# Patient Record
Sex: Male | Born: 1980 | Race: White | Hispanic: No | Marital: Single | Smoking: Current every day smoker
Health system: Southern US, Community
[De-identification: ages and names within clinical notes are randomized; demographics above are authoritative.]

## PROBLEM LIST (undated history)

## (undated) DIAGNOSIS — J939 Pneumothorax, unspecified: Secondary | ICD-10-CM

## (undated) HISTORY — PX: CHEST TUBE INSERTION: SHX231

---

## 2002-08-03 ENCOUNTER — Emergency Department (HOSPITAL_COMMUNITY): Admission: EM | Admit: 2002-08-03 | Discharge: 2002-08-03 | Payer: Self-pay | Admitting: Emergency Medicine

## 2002-10-20 ENCOUNTER — Emergency Department (HOSPITAL_COMMUNITY): Admission: EM | Admit: 2002-10-20 | Discharge: 2002-10-21 | Payer: Self-pay | Admitting: Emergency Medicine

## 2002-11-29 ENCOUNTER — Emergency Department (HOSPITAL_COMMUNITY): Admission: EM | Admit: 2002-11-29 | Discharge: 2002-11-30 | Payer: Self-pay | Admitting: *Deleted

## 2003-12-11 ENCOUNTER — Emergency Department (HOSPITAL_COMMUNITY): Admission: EM | Admit: 2003-12-11 | Discharge: 2003-12-12 | Payer: Self-pay | Admitting: Emergency Medicine

## 2004-07-26 ENCOUNTER — Emergency Department (HOSPITAL_COMMUNITY): Admission: EM | Admit: 2004-07-26 | Discharge: 2004-07-26 | Payer: Self-pay | Admitting: Emergency Medicine

## 2006-04-29 ENCOUNTER — Emergency Department (HOSPITAL_COMMUNITY): Admission: EM | Admit: 2006-04-29 | Discharge: 2006-04-29 | Payer: Self-pay | Admitting: Emergency Medicine

## 2006-11-04 ENCOUNTER — Emergency Department (HOSPITAL_COMMUNITY): Admission: EM | Admit: 2006-11-04 | Discharge: 2006-11-05 | Payer: Self-pay | Admitting: Emergency Medicine

## 2009-06-12 ENCOUNTER — Emergency Department (HOSPITAL_COMMUNITY): Admission: EM | Admit: 2009-06-12 | Discharge: 2009-06-13 | Payer: Self-pay | Admitting: Emergency Medicine

## 2011-02-13 ENCOUNTER — Encounter: Payer: Self-pay | Admitting: Emergency Medicine

## 2011-02-13 ENCOUNTER — Emergency Department (HOSPITAL_COMMUNITY)
Admission: EM | Admit: 2011-02-13 | Discharge: 2011-02-13 | Disposition: A | Discharge status: Home / Self Care | Payer: Self-pay | Attending: Emergency Medicine | Admitting: Emergency Medicine

## 2011-02-13 DIAGNOSIS — F17200 Nicotine dependence, unspecified, uncomplicated: Secondary | ICD-10-CM | POA: Insufficient documentation

## 2011-02-13 DIAGNOSIS — R1033 Periumbilical pain: Secondary | ICD-10-CM | POA: Insufficient documentation

## 2011-02-13 DIAGNOSIS — R197 Diarrhea, unspecified: Secondary | ICD-10-CM | POA: Insufficient documentation

## 2011-02-13 DIAGNOSIS — R630 Anorexia: Secondary | ICD-10-CM | POA: Insufficient documentation

## 2011-02-13 DIAGNOSIS — R109 Unspecified abdominal pain: Secondary | ICD-10-CM

## 2011-02-13 DIAGNOSIS — R112 Nausea with vomiting, unspecified: Secondary | ICD-10-CM | POA: Insufficient documentation

## 2011-02-13 LAB — COMPREHENSIVE METABOLIC PANEL
ALT: 17 U/L (ref 0–53)
AST: 21 U/L (ref 0–37)
CO2: 24 mEq/L (ref 19–32)
Creatinine, Ser: 0.79 mg/dL (ref 0.50–1.35)
GFR calc Af Amer: >60 mL/min (ref >60)
GFR calc non Af Amer: >60 mL/min (ref >60)
Potassium: 4 mEq/L (ref 3.5–5.1)
Sodium: 139 mEq/L (ref 135–145)
Total Bilirubin: 0.4 mg/dL (ref 0.3–1.2)

## 2011-02-13 LAB — URINALYSIS, ROUTINE W REFLEX MICROSCOPIC
Glucose, UA: NEGATIVE mg/dL
Leukocytes, UA: NEGATIVE
Nitrite: NEGATIVE
Protein, ur: NEGATIVE mg/dL
pH: 6 (ref 5.0–8.0)

## 2011-02-13 LAB — DIFFERENTIAL
Basophils Relative: 0 % (ref 0–1)
Lymphs Abs: 1.5 10*3/uL (ref 0.7–4.0)
Monocytes Relative: 8 % (ref 3–12)
Neutrophils Relative %: 75 % (ref 43–77)

## 2011-02-13 LAB — CBC
MCHC: 33.7 g/dL (ref 30.0–36.0)
MCV: 89.9 fL (ref 78.0–100.0)
Platelets: 227 10*3/uL (ref 150–400)
RDW: 12.7 % (ref 11.5–15.5)

## 2011-02-13 MED ORDER — PROMETHAZINE HCL 25 MG PO TABS: 25.0000 mg | ORAL_TABLET | Freq: Four times a day (QID) | ORAL | Status: AC | PRN

## 2011-02-13 NOTE — ED Notes (Signed)
Patient with no complaints at this time. Respirations even and unlabored. Skin warm/dry. Discharge instructions reviewed with patient at this time. Patient given opportunity to voice concerns/ask questions. IV removed per policy and band-aid applied to site. Patient discharged at this time and left Emergency Department with steady gait.  

## 2011-02-13 NOTE — ED Notes (Signed)
Pt c/o pain around navel area with n/v/d since last night. Denies stool black /bloody. Nad. No s/s of dehydration. Mm wet

## 2011-02-13 NOTE — ED Provider Notes (Signed)
History    Written by Enos Fling acting as scribe for Lyanne Co, MD.  Chief Complaint  Patient presents with  . Abdominal Pain  . Diarrhea   HPI Comments: Pt reports sharp periumbilical abd pain and diarrhea onset last night. Abd pain is intermittent, sometimes followed by diarrhea. Also reports 3 episodes of vomiting over past 3 weeks and another mild episode this AM. Nausea resolved after vomiting. Pt states he feels well right now, abd pain improved at this time.  Patient is a 30 y.o. male presenting with abdominal pain and diarrhea. The history is provided by the patient.  Abdominal Pain The primary symptoms of the illness include abdominal pain, nausea, vomiting and diarrhea.  The abdominal pain began 6 to 12 hours ago. The abdominal pain has been unchanged since its onset. The abdominal pain is located in the periumbilical region. The abdominal pain does not radiate. The abdominal pain is relieved by nothing.  The diarrhea began yesterday. The diarrhea occurs 2 to 4 times per day.  Additional symptoms associated with the illness include anorexia. Symptoms associated with the illness do not include chills, diaphoresis, heartburn, constipation, urgency, hematuria, frequency or back pain.  Diarrhea The primary symptoms include abdominal pain, nausea, vomiting and diarrhea.  The illness is also significant for anorexia. The illness does not include chills, constipation or back pain.    History reviewed. No pertinent past medical history.  History reviewed. No pertinent past surgical history.  History reviewed. No pertinent family history.  History  Substance Use Topics  . Smoking status: Current Everyday Smoker    Types: Cigarettes  . Smokeless tobacco: Not on file  . Alcohol Use: No      Review of Systems  Constitutional: Negative for chills and diaphoresis.  Gastrointestinal: Positive for nausea, vomiting, abdominal pain, diarrhea and anorexia. Negative for  heartburn and constipation.  Genitourinary: Negative for urgency, frequency and hematuria.  Musculoskeletal: Negative for back pain.  All other systems reviewed and are negative.    Physical Exam  BP 129/79  Pulse 80  Temp 97.6 F (36.4 C)  Resp 18  Ht 6\' 4"  (1.93 m)  Wt 150 lb (68.04 kg)  BMI 18.26 kg/m2  SpO2 100%  Physical Exam  Nursing note and vitals reviewed. Constitutional: He is oriented to person, place, and time. He appears well-developed and well-nourished. No distress.  HENT:  Head: Normocephalic.  Mouth/Throat: Mucous membranes are normal.  Eyes:       Normal appearance  Neck: Normal range of motion. Neck supple.  Cardiovascular: Normal rate and regular rhythm.  Exam reveals no gallop and no friction rub.   No murmur heard. Pulmonary/Chest: Effort normal and breath sounds normal. He has no wheezes. He has no rhonchi. He has no rales.  Abdominal: Soft. He exhibits no distension. There is no tenderness.  Musculoskeletal: Normal range of motion.       Normal appearance  Neurological: He is alert and oriented to person, place, and time.       Motor intact in all extremities  Skin: Skin is warm and dry. No rash noted.       Color normal  Psychiatric: He has a normal mood and affect.    ED Course  Procedures Results for orders placed during the hospital encounter of 02/13/11  CBC      Component Value Range   WBC 9.6  4.0 - 10.5 (K/uL)   RBC 5.05  4.22 - 5.81 (MIL/uL)   Hemoglobin 15.3  13.0 - 17.0 (g/dL)   HCT 16.1  09.6 - 04.5 (%)   MCV 89.9  78.0 - 100.0 (fL)   MCH 30.3  26.0 - 34.0 (pg)   MCHC 33.7  30.0 - 36.0 (g/dL)   RDW 40.9  81.1 - 91.4 (%)   Platelets 227  150 - 400 (K/uL)  DIFFERENTIAL      Component Value Range   Neutrophils Relative 75  43 - 77 (%)   Neutro Abs 7.2  1.7 - 7.7 (K/uL)   Lymphocytes Relative 16  12 - 46 (%)   Lymphs Abs 1.5  0.7 - 4.0 (K/uL)   Monocytes Relative 8  3 - 12 (%)   Monocytes Absolute 0.8  0.1 - 1.0 (K/uL)    Eosinophils Relative 1  0 - 5 (%)   Eosinophils Absolute 0.1  0.0 - 0.7 (K/uL)   Basophils Relative 0  0 - 1 (%)   Basophils Absolute 0.0  0.0 - 0.1 (K/uL)  COMPREHENSIVE METABOLIC PANEL      Component Value Range   Sodium 139  135 - 145 (mEq/L)   Potassium 4.0  3.5 - 5.1 (mEq/L)   Chloride 104  96 - 112 (mEq/L)   CO2 24  19 - 32 (mEq/L)   Glucose, Bld 108 (*) 70 - 99 (mg/dL)   BUN 14  6 - 23 (mg/dL)   Creatinine, Ser 7.82  0.50 - 1.35 (mg/dL)   Calcium 9.8  8.4 - 95.6 (mg/dL)   Total Protein 8.2  6.0 - 8.3 (g/dL)   Albumin 4.1  3.5 - 5.2 (g/dL)   AST 21  0 - 37 (U/L)   ALT 17  0 - 53 (U/L)   Alkaline Phosphatase 109  39 - 117 (U/L)   Total Bilirubin 0.4  0.3 - 1.2 (mg/dL)   GFR calc non Af Amer >60  >60 (mL/min)   GFR calc Af Amer >60  >60 (mL/min)  URINALYSIS, ROUTINE W REFLEX MICROSCOPIC      Component Value Range   Color, Urine YELLOW  YELLOW    Appearance CLEAR  CLEAR    Specific Gravity, Urine 1.010  1.005 - 1.030    pH 6.0  5.0 - 8.0    Glucose, UA NEGATIVE  NEGATIVE (mg/dL)   Hgb urine dipstick NEGATIVE  NEGATIVE    Bilirubin Urine NEGATIVE  NEGATIVE    Ketones, ur NEGATIVE  NEGATIVE (mg/dL)   Protein, ur NEGATIVE  NEGATIVE (mg/dL)   Urobilinogen, UA 0.2  0.0 - 1.0 (mg/dL)   Nitrite NEGATIVE  NEGATIVE    Leukocytes, UA NEGATIVE  NEGATIVE    No results found.   MDM  Personally reviewed the laboratory studies.  The patient's abdomen is benign on exam.  My suspicion for appendicitis is very low at this time.  No indication for imaging of the abdomen at this time.  Patient has been asked to return to the ER tomorrow for 24-hour abdominal pain recheck.  Patient is agreeable to outpatient plan.  He understands to return sooner for worsening abdominal pain fever greater than 101 or any other new or worsening symptoms  I personally performed the services described in this documentation, which was scribed in my presence. The recorded information has been reviewed and  considered. Lyanne Co, MD        Lyanne Co, MD 02/13/11 [phone]

## 2011-08-28 ENCOUNTER — Emergency Department (HOSPITAL_COMMUNITY): Payer: Self-pay

## 2011-08-28 ENCOUNTER — Inpatient Hospital Stay (HOSPITAL_COMMUNITY)
Admission: EM | Admit: 2011-08-28 | Discharge: 2011-08-30 | DRG: 201 | Disposition: A | Discharge status: Home / Self Care | Payer: Self-pay | Attending: General Surgery | Admitting: General Surgery

## 2011-08-28 ENCOUNTER — Encounter (HOSPITAL_COMMUNITY): Payer: Self-pay | Admitting: *Deleted

## 2011-08-28 ENCOUNTER — Other Ambulatory Visit: Payer: Self-pay

## 2011-08-28 DIAGNOSIS — J9383 Other pneumothorax: Principal | ICD-10-CM | POA: Diagnosis present

## 2011-08-28 DIAGNOSIS — F17200 Nicotine dependence, unspecified, uncomplicated: Secondary | ICD-10-CM | POA: Diagnosis present

## 2011-08-28 DIAGNOSIS — K029 Dental caries, unspecified: Secondary | ICD-10-CM | POA: Diagnosis present

## 2011-08-28 LAB — PROTIME-INR
INR: 1.04 (ref 0.00–1.49)
Prothrombin Time: 13.8 seconds (ref 11.6–15.2)

## 2011-08-28 LAB — DIFFERENTIAL
Basophils Absolute: 0 10*3/uL (ref 0.0–0.1)
Basophils Relative: 0 % (ref 0–1)
Eosinophils Absolute: 0.1 10*3/uL (ref 0.0–0.7)
Eosinophils Relative: 1 % (ref 0–5)
Monocytes Absolute: 0.9 10*3/uL (ref 0.1–1.0)
Monocytes Relative: 9 % (ref 3–12)
Neutrophils Relative %: 72 % (ref 43–77)

## 2011-08-28 LAB — CBC
HCT: 42.3 % (ref 39.0–52.0)
MCH: 30.1 pg (ref 26.0–34.0)
Platelets: 213 10*3/uL (ref 150–400)
WBC: 9.9 10*3/uL (ref 4.0–10.5)

## 2011-08-28 LAB — BASIC METABOLIC PANEL
Calcium: 9.7 mg/dL (ref 8.4–10.5)
Creatinine, Ser: 0.86 mg/dL (ref 0.50–1.35)
GFR calc non Af Amer: >90 mL/min (ref >90)

## 2011-08-28 MED ORDER — PANTOPRAZOLE SODIUM 40 MG IV SOLR
40.0000 mg | Freq: Every day | INTRAVENOUS | Status: DC
  Administered 2011-08-28 – 2011-08-29 (×2): 40 mg via INTRAVENOUS
  Filled 2011-08-28 (×2): qty 40

## 2011-08-28 MED ORDER — HYDROMORPHONE HCL PF 1 MG/ML IJ SOLN
1.0000 mg | Freq: Once | INTRAMUSCULAR | Status: AC
  Administered 2011-08-28: 1 mg via INTRAVENOUS
  Filled 2011-08-28: qty 1

## 2011-08-28 MED ORDER — ONDANSETRON HCL 4 MG/2ML IJ SOLN: 4.0000 mg | Freq: Four times a day (QID) | INTRAMUSCULAR | Status: DC | PRN

## 2011-08-28 MED ORDER — HYDROMORPHONE HCL PF 1 MG/ML IJ SOLN
INTRAMUSCULAR | Status: AC
  Administered 2011-08-28: 1 mg
  Filled 2011-08-28: qty 1

## 2011-08-28 MED ORDER — BIOTENE DRY MOUTH MT LIQD
15.0000 mL | Freq: Two times a day (BID) | OROMUCOSAL | Status: DC
  Administered 2011-08-29 – 2011-08-30 (×3): 15 mL via OROMUCOSAL

## 2011-08-28 MED ORDER — ENOXAPARIN SODIUM 40 MG/0.4ML ~~LOC~~ SOLN
40.0000 mg | SUBCUTANEOUS | Status: DC
  Administered 2011-08-28 – 2011-08-29 (×2): 40 mg via SUBCUTANEOUS
  Filled 2011-08-28: qty 0.4
  Filled 2011-08-28: qty 0.8

## 2011-08-28 MED ORDER — ONDANSETRON HCL 4 MG/2ML IJ SOLN
INTRAMUSCULAR | Status: AC
  Administered 2011-08-28: 4 mg via INTRAVENOUS
  Filled 2011-08-28: qty 2

## 2011-08-28 MED ORDER — NICOTINE 21 MG/24HR TD PT24
21.0000 mg | MEDICATED_PATCH | Freq: Every day | TRANSDERMAL | Status: DC
  Administered 2011-08-28 – 2011-08-30 (×3): 21 mg via TRANSDERMAL
  Filled 2011-08-28 (×3): qty 1

## 2011-08-28 MED ORDER — PNEUMOCOCCAL VAC POLYVALENT 25 MCG/0.5ML IJ INJ
0.5000 mL | INJECTION | INTRAMUSCULAR | Status: AC
  Administered 2011-08-29: 0.5 mL via INTRAMUSCULAR
  Filled 2011-08-28: qty 0.5

## 2011-08-28 MED ORDER — INFLUENZA VIRUS VACC SPLIT PF IM SUSP
0.5000 mL | INTRAMUSCULAR | Status: AC
  Administered 2011-08-29: 0.5 mL via INTRAMUSCULAR
  Filled 2011-08-28: qty 0.5

## 2011-08-28 MED ORDER — HYDROCODONE-ACETAMINOPHEN 5-325 MG PO TABS
1.0000 | ORAL_TABLET | ORAL | Status: DC | PRN
  Administered 2011-08-29 (×3): 2 via ORAL
  Administered 2011-08-29: 1 via ORAL
  Filled 2011-08-28: qty 1
  Filled 2011-08-28 (×3): qty 2

## 2011-08-28 MED ORDER — SODIUM CHLORIDE 0.9 % IV SOLN
Freq: Once | INTRAVENOUS | Status: AC
  Administered 2011-08-28: 20:00:00 via INTRAVENOUS

## 2011-08-28 MED ORDER — HYDROMORPHONE HCL PF 1 MG/ML IJ SOLN: 0.5000 mg | INTRAMUSCULAR | Status: DC | PRN

## 2011-08-28 NOTE — ED Provider Notes (Signed)
This chart was scribed for [employer]. Colon Branch, MD by Wallis Mart. The patient was seen in room APA08/APA08 and the patient's care was started at 7:19 PM.   CSN: [id number]  Arrival date & time 08/28/11  1804   First MD Initiated Contact with Patient 08/28/11 1912      Chief Complaint  Patient presents with  . Chest Pain    (Consider location/radiation/quality/duration/timing/severity/associated sxs/prior treatment) HPI  Travis Allen is a 31 y.o. male who presents to the Emergency Department complaining of sudden onset, peristance of constant moderate to severe left sided chest pain that radiates down his side that started this morning at 8 AM as soon as he woke up and got out of bed.  Pt c/o associated coughing and SOB.  Pain worsens when taking a deep breath and coughing.   Nothing improves the pain.  Pt denies nausea, vomiting.   History reviewed. No pertinent past medical history.  History reviewed. No pertinent past surgical history.  History reviewed. No pertinent family history.  History  Substance Use Topics  . Smoking status: Current Everyday Smoker -- 1.0 packs/day    Types: Cigarettes  . Smokeless tobacco: Not on file  . Alcohol Use: Yes     occasionally      Review of Systems 10 Systems reviewed and are negative for acute change except as noted in the HPI.  Allergies  Review of patient's allergies indicates no known allergies.  Home Medications  No current outpatient prescriptions on file.  BP 120/67  Pulse 84  Temp(Src) 97.8 F (36.6 C) (Oral)  Resp 18  Ht 6\' 4"  (1.93 m)  Wt 140 lb (63.504 kg)  BMI 17.04 kg/m2  SpO2 100%  Physical Exam  Nursing note and vitals reviewed. Constitutional: He is oriented to person, place, and time. He appears well-developed and well-nourished. No distress.  HENT:  Head: Normocephalic and atraumatic.       Multiple teeth with dental decay, broken teeth  Eyes: EOM are normal. Pupils are equal, round, and reactive  to light.  Neck: Normal range of motion. Neck supple. No tracheal deviation present.  Cardiovascular: Normal rate and regular rhythm.  Exam reveals no friction rub.   No murmur heard. Pulmonary/Chest: Effort normal and breath sounds normal. No respiratory distress. He has no wheezes. He has no rales.       Few fine crackles at left base  Abdominal: Soft. He exhibits no distension.  Musculoskeletal: Normal range of motion. He exhibits no edema.  Neurological: He is alert and oriented to person, place, and time. No sensory deficit.  Skin: Skin is warm and dry.  Psychiatric: He has a normal mood and affect. His behavior is normal.    ED Course  Procedures (including critical care time) DIAGNOSTIC STUDIES: Oxygen Saturation is 100% on room air, normal by my interpretation.    COORDINATION OF CARE:  7:20 PM: EDP discussed radiology results (40% pneumothorax) with pt, waiting for on-call surgeon to call back to schedule surgery 7:24 Spoke with Dr. Leticia Penna, general surgeon. He will place chest tube and arrange for admission. Labs Reviewed - No data to display Dg Chest 2 View  08/28/2011  *RADIOLOGY REPORT*  Clinical Data: Chest pain  CHEST - 2 VIEW  Comparison: None.  Findings: Cardiomediastinal silhouette is unremarkable.  There is a large at least 40% left pneumothorax.  Mild atelectasis left lower lobe.  Right lung is clear. No pulmonary edema.  IMPRESSION:  Large left pneumothorax at least 40%.  Critical findings discussed with Dr. Colon Branch from emergency room.  Original Report Authenticated By: Natasha Mead, M.D.   Dg Chest Portable 1 View  08/28/2011  *RADIOLOGY REPORT*  Clinical Data: Pneumothorax, post chest tube placement  PORTABLE CHEST - 1 VIEW  Comparison:  Earlier film of the same day  Findings: There has been placement of a small caliber   left chest tube laterally.  Interval evacuation of the pneumothorax with only a trivial residual component noted laterally at the apex.  Lungs are  clear.  Heart size normal.  IMPRESSION:  Left chest tube placement with near complete evacuation of pneumothorax.  Original Report Authenticated By: Osa Craver, M.D.   Results for orders placed during the hospital encounter of 08/28/11  CBC      Component Value Range   WBC 9.9  4.0 - 10.5 (K/uL)   RBC 4.71  4.22 - 5.81 (MIL/uL)   Hemoglobin 14.2  13.0 - 17.0 (g/dL)   HCT 16.1  09.6 - 04.5 (%)   MCV 89.8  78.0 - 100.0 (fL)   MCH 30.1  26.0 - 34.0 (pg)   MCHC 33.6  30.0 - 36.0 (g/dL)   RDW 40.9  81.1 - 91.4 (%)   Platelets 213  150 - 400 (K/uL)  DIFFERENTIAL      Component Value Range   Neutrophils Relative 72  43 - 77 (%)   Neutro Abs 7.2  1.7 - 7.7 (K/uL)   Lymphocytes Relative 18  12 - 46 (%)   Lymphs Abs 1.7  0.7 - 4.0 (K/uL)   Monocytes Relative 9  3 - 12 (%)   Monocytes Absolute 0.9  0.1 - 1.0 (K/uL)   Eosinophils Relative 1  0 - 5 (%)   Eosinophils Absolute 0.1  0.0 - 0.7 (K/uL)   Basophils Relative 0  0 - 1 (%)   Basophils Absolute 0.0  0.0 - 0.1 (K/uL)  BASIC METABOLIC PANEL      Component Value Range   Sodium 137  135 - 145 (mEq/L)   Potassium 4.0  3.5 - 5.1 (mEq/L)   Chloride 103  96 - 112 (mEq/L)   CO2 26  19 - 32 (mEq/L)   Glucose, Bld 85  70 - 99 (mg/dL)   BUN 9  6 - 23 (mg/dL)   Creatinine, Ser 7.82  0.50 - 1.35 (mg/dL)   Calcium 9.7  8.4 - 95.6 (mg/dL)   GFR calc non Af Amer >90  >90 (mL/min)   GFR calc Af Amer >90  >90 (mL/min)  PROTIME-INR      Component Value Range   Prothrombin Time 13.8  11.6 - 15.2 (seconds)   INR 1.04  0.00 - 1.49   APTT      Component Value Range   aPTT 32  24 - 37 (seconds)       MDM  Patient with left sided chest pain and shortness of breath that began this morning when he woke up. Smokes a pack of cigarettes a day.Chest xray with 40% pneumothorax. General surgeon called who placed chest tube and admitted patient to med surg. The patient appears reasonably stabilized for admission considering the current resources,  flow, and capabilities available in the ED at this time, and I doubt any other [facility] requiring further screening and/or treatment in the ED prior to admission.  I personally performed the services described in this documentation, which was scribed in my presence. The recorded information has been reviewed and considered.  MDM  Reviewed: nursing note and vitals Interpretation: x-ray and labs Consults: general surgery       Nicoletta Dress. Colon Branch, MD 08/28/11 2137

## 2011-08-28 NOTE — ED Provider Notes (Signed)
1909 Call from radiology to advise patient with 40% pneumothorax.  Nicoletta Dress. Colon Branch, MD 08/28/11 Izell [place]

## 2011-08-28 NOTE — Procedures (Signed)
Chest Tube Insertion Procedure Note  Indications:  Clinically significant Pneumothorax  Pre-operative Diagnosis: Pneumothorax  Post-operative Diagnosis: Pneumothorax  Procedure Details  Informed consent was obtained for the procedure, including sedation.  Risks of lung perforation, hemorrhage, arrhythmia, and adverse drug reaction were discussed.   After sterile skin prep, using standard technique, a 8 French tube was placed in the left lateral ~4-5 rib space.  Findings: None  Estimated Blood Loss:  Minimal         Specimens:  None              Complications:  None; patient tolerated the procedure well.         Disposition: To floor for admission and management of chest-tube         Condition: stable  Attending Attestation: I performed the procedure.

## 2011-08-28 NOTE — ED Notes (Signed)
Dr zieglar in tom place in chest tube.

## 2011-08-28 NOTE — H&P (Signed)
Travis Allen is an 31 y.o. male.   Chief Complaint: Chest pain  HPI: Patient presented with less than 24 hours of left sided schest pain.  No similar symptoms in the past.  No trauma.  Awoke with symptoms.  Persistent with continued SOB through the day.  History reviewed. No pertinent past medical history.  History reviewed. No pertinent past surgical history.  History reviewed. No pertinent family history. Social History:  reports that he has been smoking Cigarettes.  He has been smoking about 1 pack per day. He does not have any smokeless tobacco history on file. He reports that he drinks alcohol. He reports that he does not use illicit drugs.  Allergies: No Known Allergies  Medications Prior to Admission  Medication Dose Route Frequency Provider Last Rate Last Dose  . 0.9 %  sodium chloride infusion   Intravenous Once [employer]. Colon Branch, MD 75 mL/hr at 08/28/11 1940    . enoxaparin (LOVENOX) injection 40 mg  40 mg Subcutaneous Q24H Fabio Bering, MD      . HYDROcodone-acetaminophen [facility]) 5-325 MG per tablet 1-2 tablet  1-2 tablet Oral Q4H PRN Fabio Bering, MD      . HYDROmorphone (DILAUDID) 1 MG/ML injection        1 mg at 08/28/11 2022  . HYDROmorphone (DILAUDID) injection 0.5 mg  0.5 mg Intravenous Q4H PRN Fabio Bering, MD      . HYDROmorphone (DILAUDID) injection 1 mg  1 mg Intravenous Once Fabio Bering, MD      . nicotine (NICODERM CQ - dosed in mg/24 hours) patch 21 mg  21 mg Transdermal Daily Fabio Bering, MD      . ondansetron [facility]) 4 MG/2ML injection        4 mg at 08/28/11 2047  . ondansetron (ZOFRAN) injection 4 mg  4 mg Intravenous Q6H PRN Fabio Bering, MD      . pantoprazole (PROTONIX) injection 40 mg  40 mg Intravenous QHS Fabio Bering, MD       No current outpatient prescriptions on file as of 08/28/2011.    Results for orders placed during the hospital encounter of 08/28/11 (from the past 48 hour(s))  CBC     Status: Normal   Collection Time   08/28/11  7:34 PM      Component Value Range Comment   WBC 9.9  4.0 - 10.5 (K/uL)    RBC 4.71  4.22 - 5.81 (MIL/uL)    Hemoglobin 14.2  13.0 - 17.0 (g/dL)    HCT 78.2  95.6 - 21.3 (%)    MCV 89.8  78.0 - 100.0 (fL)    MCH 30.1  26.0 - 34.0 (pg)    MCHC 33.6  30.0 - 36.0 (g/dL)    RDW 08.6  57.8 - 46.9 (%)    Platelets 213  150 - 400 (K/uL)   DIFFERENTIAL     Status: Normal   Collection Time   08/28/11  7:34 PM      Component Value Range Comment   Neutrophils Relative 72  43 - 77 (%)    Neutro Abs 7.2  1.7 - 7.7 (K/uL)    Lymphocytes Relative 18  12 - 46 (%)    Lymphs Abs 1.7  0.7 - 4.0 (K/uL)    Monocytes Relative 9  3 - 12 (%)    Monocytes Absolute 0.9  0.1 - 1.0 (K/uL)    Eosinophils Relative 1  0 - 5 (%)  Eosinophils Absolute 0.1  0.0 - 0.7 (K/uL)    Basophils Relative 0  0 - 1 (%)    Basophils Absolute 0.0  0.0 - 0.1 (K/uL)   BASIC METABOLIC PANEL     Status: Normal   Collection Time   08/28/11  7:34 PM      Component Value Range Comment   Sodium 137  135 - 145 (mEq/L)    Potassium 4.0  3.5 - 5.1 (mEq/L)    Chloride 103  96 - 112 (mEq/L)    CO2 26  19 - 32 (mEq/L)    Glucose, Bld 85  70 - 99 (mg/dL)    BUN 9  6 - 23 (mg/dL)    Creatinine, Ser 2.95  0.50 - 1.35 (mg/dL)    Calcium 9.7  8.4 - 10.5 (mg/dL)    GFR calc non Af Amer >90  >90 (mL/min)    GFR calc Af Amer >90  >90 (mL/min)   PROTIME-INR     Status: Normal   Collection Time   08/28/11  7:34 PM      Component Value Range Comment   Prothrombin Time 13.8  11.6 - 15.2 (seconds)    INR 1.04  0.00 - 1.49    APTT     Status: Normal   Collection Time   08/28/11  7:34 PM      Component Value Range Comment   aPTT 32  24 - 37 (seconds)    Dg Chest 2 View  08/28/2011  *RADIOLOGY REPORT*  Clinical Data: Chest pain  CHEST - 2 VIEW  Comparison: None.  Findings: Cardiomediastinal silhouette is unremarkable.  There is a large at least 40% left pneumothorax.  Mild atelectasis left lower lobe.  Right lung is clear. No pulmonary  edema.  IMPRESSION:  Large left pneumothorax at least 40%.  Critical findings discussed with Dr. Colon Branch from emergency room.  Original Report Authenticated By: Natasha Mead, M.D.    Review of Systems  Constitutional: Negative.   HENT: Negative.   Eyes: Negative.   Respiratory: Positive for cough and shortness of breath.   Cardiovascular: Positive for chest pain.  Gastrointestinal: Negative.   Genitourinary: Negative.   Musculoskeletal: Negative.   Skin: Negative.   Neurological: Negative.   Endo/Heme/Allergies: Negative.   Psychiatric/Behavioral: Negative.     Blood pressure 120/67, pulse 84, temperature 97.8 F (36.6 C), temperature source Oral, resp. rate 18, height 6\' 4"  (1.93 m), weight 63.504 kg (140 lb), SpO2 100.00%. Physical Exam  Constitutional: He is oriented to person, place, and time. He appears well-developed and well-nourished. No distress.       Thin, tall   HENT:  Head: Normocephalic and atraumatic.  Eyes: Conjunctivae and EOM are normal. Pupils are equal, round, and reactive to light. No scleral icterus.  Neck: Neck supple. No tracheal deviation present. No thyromegaly present.  Cardiovascular: Normal rate, regular rhythm and normal heart sounds.   Respiratory: Effort normal.       Decreased sounds on the Left  GI: Soft. Bowel sounds are normal. He exhibits no distension. There is no tenderness. There is no rebound.  Musculoskeletal: Normal range of motion.  Lymphadenopathy:    He has no cervical adenopathy.  Neurological: He is alert and oriented to person, place, and time.  Skin: Skin is warm and dry.     Assessment/Plan Left spontaneous PTX.  Risks, benefits and alternatives to pneumocath vs chest tube discussed with the patient.  Opted to proceed with pneumocath.  Placed in  the ED.  Tolerated well.  Will admit for continued ct management.    Bronda Alfred C 08/28/2011, 8:51 PM

## 2011-08-28 NOTE — ED Notes (Signed)
Attempted to call report

## 2011-08-28 NOTE — ED Notes (Signed)
Pt states that he started having chest pain at 0830 this am and shortness of breath. Pt states that the pain runs down his left side. Denies nausea, or vomiting. Pt also c/o dry cough that makes his pain worse.

## 2011-08-29 NOTE — Progress Notes (Signed)
  Subjective: No shortness of breath. Chest pain is improved.  Objective: Vital signs in last 24 hours: Temp:  [97 F (36.1 C)-98.7 F (37.1 C)] 97 F (36.1 C) (02/07 0927) Pulse Rate:  [58-86] 60  (02/07 0927) Resp:  [13-23] 18  (02/07 0927) BP: (91-123)/(55-70) 102/63 mmHg (02/07 0927) SpO2:  [96 %-100 %] 96 % (02/07 0940) Weight:  [63.504 kg (140 lb)] 63.504 kg (140 lb) (02/06 1811) Last BM Date: 08/28/11  Intake/Output from previous day: 02/06 0701 - 02/07 0700 In: -  Out: 500 [Urine:500] Intake/Output this shift: Total I/O In: 240 [P.O.:240] Out: 900 [Urine:900]  Resp: clear to auscultation bilaterally and No evidence of any air leak from the chest tube. Noted Pleur-evac is to water seal.  Lab Results:   [facility] 08/28/11 1934  WBC 9.9  HGB 14.2  HCT 42.3  PLT 213   BMET  Basename 08/28/11 1934  NA 137  K 4.0  CL 103  CO2 26  GLUCOSE 85  BUN 9  CREATININE 0.86  CALCIUM 9.7   PT/INR  Basename 08/28/11 1934  LABPROT 13.8  INR 1.04   ABG No results found for this basename: PHART:2,PCO2:2,PO2:2,HCO3:2 in the last 72 hours  Studies/Results: Dg Chest 2 View  08/28/2011  *RADIOLOGY REPORT*  Clinical Data: Chest pain  CHEST - 2 VIEW  Comparison: None.  Findings: Cardiomediastinal silhouette is unremarkable.  There is a large at least 40% left pneumothorax.  Mild atelectasis left lower lobe.  Right lung is clear. No pulmonary edema.  IMPRESSION:  Large left pneumothorax at least 40%.  Critical findings discussed with Dr. Colon Branch from emergency room.  Original Report Authenticated By: Natasha Mead, M.D.   Dg Chest Portable 1 View  08/28/2011  *RADIOLOGY REPORT*  Clinical Data: Pneumothorax, post chest tube placement  PORTABLE CHEST - 1 VIEW  Comparison:  Earlier film of the same day  Findings: There has been placement of a small caliber   left chest tube laterally.  Interval evacuation of the pneumothorax with only a trivial residual component noted laterally at  the apex.  Lungs are clear.  Heart size normal.  IMPRESSION:  Left chest tube placement with near complete evacuation of pneumothorax.  Original Report Authenticated By: Osa Craver, M.D.    Anti-infectives: Anti-infectives    None      Assessment/Plan: s/p * No surgery found * Left-sided pneumothorax. Continue pneumocath for now. Continued to water seal. We'll reevaluate chest x-ray in the morning likely discharge tomorrow after removal of pneumocath.  LOS: 1 day    Jaimes Eckert C 08/29/2011

## 2011-08-29 NOTE — Progress Notes (Deleted)
PIV removed without complaint, patient discharged home. Patient verbalizes understanding of discharge instructions, follow up care and prescriptions. Patient escorted out by staff, transported by family. 

## 2011-08-30 ENCOUNTER — Inpatient Hospital Stay (HOSPITAL_COMMUNITY): Payer: Self-pay

## 2011-08-30 NOTE — Progress Notes (Signed)
UR Chart Review Completed  

## 2011-08-30 NOTE — Discharge Summary (Signed)
Physician Discharge Summary  Patient ID: TC KAPUSTA MRN: [id number] DOB/AGE: 33-Oct-1982 32 y.o.  Admit date: 08/28/2011 Discharge date: 08/30/2011  Admission Diagnoses: Left-sided spontaneous pneumothorax Discharge Diagnoses: The same Active Problems:  * No active hospital problems. *    Discharged Condition: stable  Hospital Course: Patient presented to [facility] emergency department with increased shortness of breath and chest pain. Workup was consistent with a spontaneous pneumothorax. Risks benefits and alternatives of intervention were discussed with the patient. In pneumocath was placed in the emergency department. Patient was admitted for continued management of the pneumothorax. 4 hours after placed in an intact there is no evidence of any recurrence or air leak from the Pleur-evac. At this point the catheter was placed to waterseal and was again monitored for another 24 hours. At this point again morning x-ray demonstrates no evidence of recurrence of pneumothorax. There is no air leak within the Pleur-evac canister. In the chest tube is removed at this point. Occlusive dressing was placed. Patient is doing extremely well. I did advise the patient to MICU daily over the weekend however he may return to work on Monday. Patient is to call if any problems questions or concerns.  Consults: None  Significant Diagnostic Studies: radiology: CXR: Pneumothorax  Treatments: Chest tube  Discharge Exam: Blood pressure 109/68, pulse 69, temperature 97.4 F (36.3 C), temperature source Oral, resp. rate 18, height 6\' 4"  (1.93 m), weight 63.504 kg (140 lb), SpO2 95.00%. General appearance: alert and no distress Resp: clear to auscultation bilaterally.  No air leak within the canister. The cath was removed at the bedside with occlusive dressing placed.  Disposition: Home or Self Care  Discharge Orders    Future Orders Please Complete By Expires   Diet - low sodium heart healthy      Increase activity slowly      Discharge instructions      Comments:   Increase activity as tolerated. May place ice pack for comfort.    Discharge wound care:      Comments:   Clean surgical sites with soap and water.  May shower the morning after surgery unless instructed by Dr. Leticia Penna otherwise.  No soaking for 2-3 weeks.    If adhesive strips are in place, they may be removed in 1-2 weeks while in the shower.    Call MD for:  temperature >100.4      Call MD for:  persistant nausea and vomiting      Call MD for:  severe uncontrolled pain      Call MD for:  redness, tenderness, or signs of infection (pain, swelling, redness, odor or green/yellow discharge around incision site)      Call MD for:  difficulty breathing, headache or visual disturbances        Medication List    Notice       You have not been prescribed any medications.            Follow-up Information    Follow up with Fabio Bering, MD. (As needed)    Contact information:   [address] [place] [address] [phone]          Signed: Fabio Bering 08/30/2011, 11:55 AM

## 2011-08-30 NOTE — Progress Notes (Signed)
Discharge summary: a/o.vss. Saline lock removed. Chest tube removed by Dr. Leticia Penna.  Dressing clean dry and intact. Up ad lib. Discharge instructions given. Pt verbalized understanding of discharge instructions.

## 2011-12-18 ENCOUNTER — Emergency Department (HOSPITAL_COMMUNITY)
Admission: EM | Admit: 2011-12-18 | Discharge: 2011-12-18 | Disposition: A | Discharge status: Home / Self Care | Payer: Self-pay | Attending: Emergency Medicine | Admitting: Emergency Medicine

## 2011-12-18 ENCOUNTER — Encounter (HOSPITAL_COMMUNITY): Payer: Self-pay | Admitting: *Deleted

## 2011-12-18 DIAGNOSIS — F17200 Nicotine dependence, unspecified, uncomplicated: Secondary | ICD-10-CM | POA: Insufficient documentation

## 2011-12-18 DIAGNOSIS — J029 Acute pharyngitis, unspecified: Secondary | ICD-10-CM | POA: Insufficient documentation

## 2011-12-18 HISTORY — DX: Pneumothorax, unspecified: J93.9

## 2011-12-18 NOTE — Discharge Instructions (Signed)
Pharyngitis, Viral and Bacterial Pharyngitis is soreness (inflammation) or infection of the pharynx. It is also called a sore throat. CAUSES  Most sore throats are caused by viruses and are part of a cold. However, some sore throats are caused by strep and other bacteria. Sore throats can also be caused by post nasal drip from draining sinuses, allergies and sometimes from sleeping with an open mouth. Infectious sore throats can be spread from person to person by coughing, sneezing and sharing cups or eating utensils. TREATMENT  Sore throats that are viral usually last 3-4 days. Viral illness will get better without medications (antibiotics). Strep throat and other bacterial infections will usually begin to get better about 24-48 hours after you begin to take antibiotics. HOME CARE INSTRUCTIONS   If the caregiver feels there is a bacterial infection or if there is a positive strep test, they will prescribe an antibiotic. The full course of antibiotics must be taken. If the full course of antibiotic is not taken, you or your child may become ill again. If you or your child has strep throat and do not finish all of the medication, serious heart or kidney diseases may develop.   Drink enough water and fluids to keep your urine clear or pale yellow.   Only take over-the-counter or prescription medicines for pain, discomfort or fever as directed by your caregiver.   Get lots of rest.   Gargle with salt water ( tsp. of salt in a glass of water) as often as every 1-2 hours as you need for comfort.   Hard candies may soothe the throat if individual is not at risk for choking. Throat sprays or lozenges may also be used.  SEEK MEDICAL CARE IF:   Large, tender lumps in the neck develop.   A rash develops.   Green, yellow-brown or bloody sputum is coughed up.   Your baby is older than 3 months with a rectal temperature of 100.5 F (38.1 C) or higher for more than 1 day.  SEEK IMMEDIATE MEDICAL CARE  IF:   A stiff neck develops.   You or your child are drooling or unable to swallow liquids.   You or your child are vomiting, unable to keep medications or liquids down.   You or your child has severe pain, unrelieved with recommended medications.   You or your child are having difficulty breathing (not due to stuffy nose).   You or your child are unable to fully open your mouth.   You or your child develop redness, swelling, or severe pain anywhere on the neck.   You have a fever.   Your baby is older than 3 months with a rectal temperature of 102 F (38.9 C) or higher.   Your baby is 45 months old or younger with a rectal temperature of 100.4 F (38 C) or higher.  MAKE SURE YOU:   Understand these instructions.   Will watch your condition.   Will get help right away if you are not doing well or get worse.  Document Released: 07/08/2005 Document Revised: 06/27/2011 Document Reviewed: 10/05/2007 [facility] Patient Information 2012 [place], [place].   The strep screen is negative.  Alternate tylenol 1000 mg and ibuprofen 800 mg every 4 hrs for fever and discomfort.  Gargle frequently with salt water.  Chloraseptic will help also.

## 2011-12-18 NOTE — ED Notes (Signed)
C/o sore throat for a week, denies fever and denies cough

## 2011-12-18 NOTE — ED Provider Notes (Signed)
History     CSN: [id number]  Arrival date & time 12/18/11  1616   First MD Initiated Contact with Patient 12/18/11 1716      Chief Complaint  Patient presents with  . Sore Throat    (Consider location/radiation/quality/duration/timing/severity/associated sxs/prior treatment) Patient is a 31 y.o. male presenting with pharyngitis. The history is provided by the patient. No language interpreter was used.  Sore Throat This is a new problem. Episode onset: 1 week ago. The problem occurs constantly. The problem has been unchanged. Associated symptoms include chills, a fever and a sore throat. Pertinent negatives include no coughing or rash. Associated symptoms comments: + subjective fever . The symptoms are aggravated by swallowing. He has tried acetaminophen and NSAIDs for the symptoms. The treatment provided no relief.    Past Medical History  Diagnosis Date  . Pneumothorax on left     Past Surgical History  Procedure Date  . Chest tube insertion     History reviewed. No pertinent family history.  History  Substance Use Topics  . Smoking status: Current Everyday Smoker -- 1.0 packs/day    Types: Cigarettes  . Smokeless tobacco: Not on file  . Alcohol Use: Yes     occasionally      Review of Systems  Constitutional: Positive for fever and chills.  HENT: Positive for sore throat. Negative for trouble swallowing.   Respiratory: Negative for cough.   Skin: Negative for rash.  All other systems reviewed and are negative.    Allergies  Review of patient's allergies indicates no known allergies.  Home Medications   Current Outpatient Rx  Name Route Sig Dispense Refill  . ACETAMINOPHEN 500 MG PO TABS Oral Take 500-1,000 mg by mouth as needed. For pain    . IBUPROFEN 200 MG PO TABS Oral Take 200-400 mg by mouth as needed. For pain      BP 110/74  Pulse 81  Temp(Src) 98.7 F (37.1 C) (Oral)  Resp 16  Ht 6\' 4"  (1.93 m)  Wt 150 lb (68.04 kg)  BMI 18.26 kg/m2   SpO2 99%  Physical Exam  Nursing note and vitals reviewed. Constitutional: He is oriented to person, place, and time. He appears well-developed and well-nourished.  HENT:  Head: Normocephalic and atraumatic. No trismus in the jaw.  Mouth/Throat: Uvula is midline and mucous membranes are normal. Abnormal dentition. Dental caries present. No uvula swelling. Posterior oropharyngeal erythema present. No oropharyngeal exudate, posterior oropharyngeal edema or tonsillar abscesses.  Eyes: EOM are normal.  Neck: Normal range of motion. No tracheal deviation present.  Cardiovascular: Normal rate, regular rhythm, normal heart sounds and intact distal pulses.   Pulmonary/Chest: Effort normal and breath sounds normal. No respiratory distress.  Abdominal: Soft. He exhibits no distension. There is no tenderness.  Musculoskeletal: Normal range of motion.  Lymphadenopathy:    He has no cervical adenopathy.  Neurological: He is alert and oriented to person, place, and time.  Skin: Skin is warm and dry.  Psychiatric: He has a normal mood and affect. Judgment normal.    ED Course  Procedures (including critical care time)   Labs Reviewed  RAPID STREP SCREEN   No results found.   1. Pharyngitis       MDM  Strep neg.  Tylenol or ibuprofen.  Salt water gargles.  Chloraseptic.        Worthy Rancher, PA 12/18/11 [phone]

## 2011-12-18 NOTE — ED Provider Notes (Signed)
Medical screening examination/treatment/procedure(s) were performed by non-physician practitioner and as supervising physician I was immediately available for consultation/collaboration.   Shelda Jakes, MD 12/18/11 [phone]

## 2012-03-22 ENCOUNTER — Inpatient Hospital Stay (HOSPITAL_COMMUNITY): Payer: Self-pay

## 2012-03-22 ENCOUNTER — Emergency Department (HOSPITAL_COMMUNITY): Payer: Self-pay

## 2012-03-22 ENCOUNTER — Encounter (HOSPITAL_COMMUNITY): Payer: Self-pay | Admitting: Emergency Medicine

## 2012-03-22 ENCOUNTER — Inpatient Hospital Stay (HOSPITAL_COMMUNITY)
Admission: EM | Admit: 2012-03-22 | Discharge: 2012-03-25 | DRG: 201 | Disposition: A | Discharge status: Home / Self Care | Payer: MEDICAID | Attending: General Surgery | Admitting: General Surgery

## 2012-03-22 DIAGNOSIS — F17200 Nicotine dependence, unspecified, uncomplicated: Secondary | ICD-10-CM | POA: Diagnosis present

## 2012-03-22 DIAGNOSIS — J939 Pneumothorax, unspecified: Secondary | ICD-10-CM

## 2012-03-22 DIAGNOSIS — J9383 Other pneumothorax: Principal | ICD-10-CM | POA: Diagnosis present

## 2012-03-22 MED ORDER — HYDROMORPHONE HCL PF 1 MG/ML IJ SOLN
1.0000 mg | INTRAMUSCULAR | Status: AC | PRN
  Administered 2012-03-22: 1 mg via INTRAVENOUS

## 2012-03-22 MED ORDER — HYDROCODONE-ACETAMINOPHEN 5-325 MG PO TABS
1.0000 | ORAL_TABLET | ORAL | Status: DC | PRN
  Administered 2012-03-22: 2 via ORAL
  Administered 2012-03-23: 1 via ORAL
  Administered 2012-03-24: 2 via ORAL
  Filled 2012-03-22: qty 1
  Filled 2012-03-22 (×2): qty 2

## 2012-03-22 MED ORDER — HYDROMORPHONE HCL PF 1 MG/ML IJ SOLN
INTRAMUSCULAR | Status: AC
  Filled 2012-03-22: qty 1

## 2012-03-22 MED ORDER — NICOTINE 21 MG/24HR TD PT24
21.0000 mg | MEDICATED_PATCH | Freq: Every day | TRANSDERMAL | Status: DC
  Administered 2012-03-23 – 2012-03-25 (×3): 21 mg via TRANSDERMAL
  Filled 2012-03-22 (×3): qty 1

## 2012-03-22 MED ORDER — ONDANSETRON HCL 4 MG/2ML IJ SOLN: 4.0000 mg | Freq: Four times a day (QID) | INTRAMUSCULAR | Status: DC | PRN

## 2012-03-22 MED ORDER — ENOXAPARIN SODIUM 40 MG/0.4ML ~~LOC~~ SOLN
40.0000 mg | SUBCUTANEOUS | Status: DC
  Administered 2012-03-23 – 2012-03-24 (×2): 40 mg via SUBCUTANEOUS
  Filled 2012-03-22 (×2): qty 0.4

## 2012-03-22 MED ORDER — PANTOPRAZOLE SODIUM 40 MG IV SOLR
40.0000 mg | Freq: Every day | INTRAVENOUS | Status: DC
  Administered 2012-03-22: 40 mg via INTRAVENOUS
  Filled 2012-03-22 (×2): qty 40

## 2012-03-22 NOTE — ED Notes (Signed)
Procedure ended

## 2012-03-22 NOTE — ED Notes (Addendum)
Pt c/o right side cp with deep breath and sob since early this am.

## 2012-03-22 NOTE — ED Notes (Signed)
Pt hr started to drop in 40s shortly after dr zieglar started procedure. Pt still alert/oriented. Pt bp 78/41. Pt diaphoretic and c/o blurred vision and dizziness. Liter bolus started WO. Dr. Cheryll Cockayne finished. Pt put in trendelenburg. Pt bp now 84/48 and states he feel a little better now. Vision back to normal. Color coming back to face. States this happened last time

## 2012-03-22 NOTE — ED Provider Notes (Signed)
History     CSN: [id number]  Arrival date & time 03/22/12  1635   First MD Initiated Contact with Patient 03/22/12 1653      Chief Complaint  Patient presents with  . Shortness of Breath  . Chest Pain    (Consider location/radiation/quality/duration/timing/severity/associated sxs/prior treatment) HPI Comments: Patient is a 31 year old man who developed pain in his right anterior chest last night. He denies cough. He has pain when he takes a deep breath. He has had a prior left pneumothorax in February of 2013. He continues to smoke a pack of cigarettes per day.  Patient is a 31 y.o. male presenting with chest pain. The history is provided by the patient and medical records. No language interpreter was used.  Chest Pain The chest pain began yesterday. Chest pain occurs constantly. The chest pain is unchanged. The pain is associated with breathing. At its most intense, the pain is at 7/10. The pain is currently at 7/10. The severity of the pain is moderate. The quality of the pain is described as pleuritic. The pain does not radiate. Chest pain is worsened by deep breathing. Pertinent negatives for primary symptoms include no fever, no shortness of breath and no cough. He tried nothing for the symptoms. Risk factors include male gender and smoking/tobacco exposure (Prior pneumothorax.). Past medical history comments: Prior left pneumothorax. Procedure history comments: Prior treatment with a left chest tube..     Past Medical History  Diagnosis Date  . Pneumothorax on left     Past Surgical History  Procedure Date  . Chest tube insertion     No family history on file.  History  Substance Use Topics  . Smoking status: Current Everyday Smoker -- 1.0 packs/day    Types: Cigarettes  . Smokeless tobacco: Not on file  . Alcohol Use: Yes     occasionally      Review of Systems  Constitutional: Negative.  Negative for fever and chills.  HENT: Negative.   Eyes: Negative.     Respiratory: Negative for cough and shortness of breath.   Cardiovascular: Positive for chest pain.  Gastrointestinal: Negative.   Genitourinary: Negative.   Musculoskeletal: Negative.   Skin: Negative.   Neurological: Negative.   Psychiatric/Behavioral: Negative.     Allergies  Review of patient's allergies indicates no known allergies.  Home Medications   Current Outpatient Rx  Name Route Sig Dispense Refill  . ACETAMINOPHEN 500 MG PO TABS Oral Take 500-1,000 mg by mouth as needed. For pain    . IBUPROFEN 200 MG PO TABS Oral Take 200-400 mg by mouth as needed. For pain      BP 115/74  Pulse 90  Temp 97.6 F (36.4 C) (Oral)  Resp 20  SpO2 100%  Physical Exam  Nursing note and vitals reviewed. Constitutional: He is oriented to person, place, and time. He appears well-developed and well-nourished.       In mild distress with right pleuritic chest pain.  HENT:  Head: Normocephalic and atraumatic.  Right Ear: External ear normal.  Left Ear: External ear normal.  Mouth/Throat: Oropharynx is clear and moist.  Eyes: Conjunctivae and EOM are normal. Pupils are equal, round, and reactive to light.  Neck: Normal range of motion. Neck supple.       Sounds audible bilaterally.  Cardiovascular: Normal rate, regular rhythm and normal heart sounds.   Pulmonary/Chest: Effort normal and breath sounds normal.  Abdominal: Soft. Bowel sounds are normal.  Musculoskeletal: Normal range of motion. He  exhibits no edema and no tenderness.  Neurological: He is alert and oriented to person, place, and time.       No sensory or motor deficit.  Skin: Skin is warm and dry.  Psychiatric: He has a normal mood and affect. His behavior is normal.    ED Course  Procedures (including critical care time)  Labs Reviewed - No data to display Dg Chest 2 View  03/22/2012  *RADIOLOGY REPORT*  Clinical Data: Smoker with chest pain.  Previous left pneumothorax.  CHEST - 2 VIEW  Comparison: Previous  examinations, the most recent dated 08/30/2011.  Findings: Less than 10% right apical pneumothorax.  No mediastinal shift.  Normal sized heart.  The lungs remain hyperexpanded with mildly prominent interstitial markings.  Unremarkable bones.  IMPRESSION:  1.  Less than 10% right apical pneumothorax. 2.  Stable changes of COPD  These results were called by telephone on 03/22/2012 at 1704 hours to Dr. Aileen Pilot, who verbally acknowledged these results.   Original Report Authenticated By: Darrol Angel, M.D.     Date: 03/22/2012  Rate: 57  Rhythm: sinus bradycardia  QRS Axis: normal  Intervals: normal  ST/T Wave abnormalities: normal  Conduction Disutrbances:none  Narrative Interpretation:  Normal EKG   Old EKG Reviewed: unchanged    5:16 PM Patient was seen and had physical examination. Chest x-ray shows a 10% right apical pneumothorax. Call to Dr. Leticia Penna, general surgeon who was previously seen the patient -->  He will be in to see pt and insert chest tube.   1. Pneumothorax        Carleene Cooper III, MD 03/22/12 [phone]

## 2012-03-22 NOTE — Procedures (Signed)
Chest Tube Insertion Procedure Note  Indications:  Clinically significant Pneumothorax  Pre-operative Diagnosis: Pneumothorax  Post-operative Diagnosis: Pneumothorax  Procedure Details  Informed consent was obtained for the procedure, including sedation.  Risks of lung perforation, hemorrhage, arrhythmia, and adverse drug reaction were discussed.   After sterile skin prep, using standard technique, a 8 French tube was placed in the right lateral ~4-5 rib space.  Findings: Air return  Estimated Blood Loss:  Minimal         Specimens:  None              Complications:  None; patient tolerated the procedure well.         Disposition: admission for management         Condition: stable  Attending Attestation: I performed the procedure.

## 2012-03-22 NOTE — ED Notes (Signed)
Informed consent signed by pt for Dr Caesar Bookman to insert chest tube to r side. Time out done.

## 2012-03-23 ENCOUNTER — Inpatient Hospital Stay (HOSPITAL_COMMUNITY): Payer: Self-pay

## 2012-03-23 LAB — GLUCOSE, CAPILLARY: Glucose-Capillary: 80 mg/dL (ref 70–99)

## 2012-03-23 MED ORDER — SODIUM CHLORIDE 0.9 % IJ SOLN
INTRAMUSCULAR | Status: AC
  Filled 2012-03-23: qty 3

## 2012-03-23 NOTE — Progress Notes (Signed)
  Subjective: No shortness of breath. No significant chest pain.  Objective: Vital signs in last 24 hours: Temp:  [97.4 F (36.3 C)-97.6 F (36.4 C)] 97.4 F (36.3 C) (09/02 0624) Pulse Rate:  [51-90] 51  (09/02 0624) Resp:  [16-20] 20  (09/02 0624) BP: (96-115)/(60-74) 102/66 mmHg (09/02 0624) SpO2:  [96 %-100 %] 99 % (09/02 0624) Weight:  [63.7 kg (140 lb 6.9 oz)] 63.7 kg (140 lb 6.9 oz) (09/01 2048)    Intake/Output from previous day: 09/01 0701 - 09/02 0700 In: -  Out: 600 [Urine:600] Intake/Output this shift:    General appearance: alert, cooperative and no distress Resp: clear to auscultation bilaterally and Chest tube in place on right side, no air leak noted. Cardio: regular rate and rhythm, S1, S2 normal, no murmur, click, rub or gallop  Lab Results:  No results found for this basename: WBC:2,HGB:2,HCT:2,PLT:2 in the last 72 hours BMET No results found for this basename: NA:2,K:2,CL:2,CO2:2,GLUCOSE:2,BUN:2,CREATININE:2,CALCIUM:2 in the last 72 hours PT/INR No results found for this basename: LABPROT:2,INR:2 in the last 72 hours  Studies/Results: Dg Chest 2 View  03/22/2012  *RADIOLOGY REPORT*  Clinical Data: Smoker with chest pain.  Previous left pneumothorax.  CHEST - 2 VIEW  Comparison: Previous examinations, the most recent dated 08/30/2011.  Findings: Less than 10% right apical pneumothorax.  No mediastinal shift.  Normal sized heart.  The lungs remain hyperexpanded with mildly prominent interstitial markings.  Unremarkable bones.  IMPRESSION:  1.  Less than 10% right apical pneumothorax. 2.  Stable changes of COPD  These results were called by telephone on 03/22/2012 at 1704 hours to Dr. Aileen Pilot, who verbally acknowledged these results.   Original Report Authenticated By: Darrol Angel, M.D.    Dg Chest Port 1 View  03/23/2012  *RADIOLOGY REPORT*  Clinical Data: Right pneumothorax.  PORTABLE CHEST - 1 VIEW  Comparison: 03/22/2012  Findings: Since the prior chest  x-ray, there is significant decrease in size of the right pneumothorax with a small caliber chest tube remaining in stable position.  Volume of pneumothorax is now approximately 5 - 10%. No pulmonary edema or pulmonary consolidation identified.  Heart size is stable.  IMPRESSION: Decrease in right pneumothorax to roughly 5 - 10% volume.   Original Report Authenticated By: Reola Calkins, M.D.    Dg Chest Port 1 View  03/22/2012  *RADIOLOGY REPORT*  Clinical Data: Follow-up right pneumothorax following chest tube placement.  PORTABLE CHEST - 1 VIEW  Comparison: Earlier today.  Findings: Interval placement of a right pleural catheter.  Interval increase in the size of the previously demonstrated right pneumothorax, currently occupying approximately 30% of the right hemithorax.  No mediastinal shift.  The lungs remain hyperexpanded and clear with central peribronchial thickening.  Normal sized heart.  Unremarkable bones.  IMPRESSION:  1.  Interval increased size of the right pneumothorax, currently occupying approximately 30% of the volume of the right hemithorax. 2.  Stable changes of COPD.  These results were called by telephone on 03/22/2012 at 1840 hours to Dr. Aileen Pilot, who verbally acknowledged these results.   Original Report Authenticated By: Darrol Angel, M.D.     Anti-infectives: Anti-infectives    None      Assessment/Plan: Impression: Right chest tube in place, day one. Chest x-ray shows improvement of right pneumothorax in apical region. Air leak has resolved at this time. We'll continue current management.  LOS: 1 day    Travis Allen A 03/23/2012

## 2012-03-24 ENCOUNTER — Inpatient Hospital Stay (HOSPITAL_COMMUNITY): Payer: Self-pay

## 2012-03-24 MED ORDER — PANTOPRAZOLE SODIUM 40 MG PO TBEC
40.0000 mg | DELAYED_RELEASE_TABLET | Freq: Every day | ORAL | Status: DC
  Administered 2012-03-24: 40 mg via ORAL
  Filled 2012-03-24: qty 1

## 2012-03-24 NOTE — Progress Notes (Signed)
The patient is receiving Protonix by the intravenous route.  Based on criteria approved by the Pharmacy and Therapeutics Committee and the Medical Executive Committee, the medication is being converted to the equivalent oral dose form.  These criteria include: -No Active GI bleeding -Able to tolerate diet of full liquids (or better) or tube feeding OR able to tolerate other medications by the oral or enteral route  If you have any questions about this conversion, please contact the Pharmacy Department (ext [phone]).  Thank you.  Elson Clan, [facility] 03/24/2012 10:54 AM

## 2012-03-24 NOTE — Progress Notes (Signed)
Subjective: No acute change.  Denies any SOB.  No CP.   Objective: Vital signs in last 24 hours: Temp:  [97.3 F (36.3 C)-98 F (36.7 C)] 97.6 F (36.4 C) (09/03 0504) Pulse Rate:  [69-79] 70  (09/03 0504) Resp:  [20] 20  (09/03 0504) BP: (97-108)/(61-69) 105/69 mmHg (09/03 0504) SpO2:  [98 %-100 %] 98 % (09/03 0504) Last BM Date:  (PTA)  Intake/Output from previous day: 09/02 0701 - 09/03 0700 In: 1120 [P.O.:1120] Out: 1500 [Urine:1500] Intake/Output this shift: Total I/O In: 600 [P.O.:600] Out: -   General appearance: alert and no distress Resp: clear to auscultation bilaterally Chest wall: no tenderness, CT in place.  No air leak.  Now to H2O seal.  Lab Results:  No results found for this basename: WBC:2,HGB:2,HCT:2,PLT:2 in the last 72 hours BMET No results found for this basename: NA:2,K:2,CL:2,CO2:2,GLUCOSE:2,BUN:2,CREATININE:2,CALCIUM:2 in the last 72 hours PT/INR No results found for this basename: LABPROT:2,INR:2 in the last 72 hours ABG No results found for this basename: PHART:2,PCO2:2,PO2:2,HCO3:2 in the last 72 hours  Studies/Results: Dg Chest 2 View  03/22/2012  *RADIOLOGY REPORT*  Clinical Data: Smoker with chest pain.  Previous left pneumothorax.  CHEST - 2 VIEW  Comparison: Previous examinations, the most recent dated 08/30/2011.  Findings: Less than 10% right apical pneumothorax.  No mediastinal shift.  Normal sized heart.  The lungs remain hyperexpanded with mildly prominent interstitial markings.  Unremarkable bones.  IMPRESSION:  1.  Less than 10% right apical pneumothorax. 2.  Stable changes of COPD  These results were called by telephone on 03/22/2012 at 1704 hours to Dr. Aileen Pilot, who verbally acknowledged these results.   Original Report Authenticated By: Darrol Angel, M.D.    Dg Chest Port 1 View  03/24/2012  *RADIOLOGY REPORT*  Clinical Data: Right pneumothorax, follow-up  PORTABLE CHEST - 1 VIEW  Comparison: Portable exam 0620 hours compared to  03/23/2012  Findings: Small bore right thoracostomy tube stable. Normal heart size, mediastinal contours, and pulmonary vascularity. Small right apex pneumothorax slightly decreased. Lungs are emphysematous but otherwise clear. No pleural effusion or acute bony findings.  IMPRESSION: Small right apex pneumothorax slightly decreased.   Original Report Authenticated By: Lollie Marrow, M.D.    Dg Chest Port 1 View  03/23/2012  *RADIOLOGY REPORT*  Clinical Data: Right pneumothorax.  PORTABLE CHEST - 1 VIEW  Comparison: 03/22/2012  Findings: Since the prior chest x-ray, there is significant decrease in size of the right pneumothorax with a small caliber chest tube remaining in stable position.  Volume of pneumothorax is now approximately 5 - 10%. No pulmonary edema or pulmonary consolidation identified.  Heart size is stable.  IMPRESSION: Decrease in right pneumothorax to roughly 5 - 10% volume.   Original Report Authenticated By: Reola Calkins, M.D.    Dg Chest Port 1 View  03/22/2012  *RADIOLOGY REPORT*  Clinical Data: Follow-up right pneumothorax following chest tube placement.  PORTABLE CHEST - 1 VIEW  Comparison: Earlier today.  Findings: Interval placement of a right pleural catheter.  Interval increase in the size of the previously demonstrated right pneumothorax, currently occupying approximately 30% of the right hemithorax.  No mediastinal shift.  The lungs remain hyperexpanded and clear with central peribronchial thickening.  Normal sized heart.  Unremarkable bones.  IMPRESSION:  1.  Interval increased size of the right pneumothorax, currently occupying approximately 30% of the volume of the right hemithorax. 2.  Stable changes of COPD.  These results were called by telephone on 03/22/2012  at 1840 hours to Dr. Aileen Pilot, who verbally acknowledged these results.   Original Report Authenticated By: Darrol Angel, M.D.     Anti-infectives: Anti-infectives    None      Assessment/Plan: s/p * No  surgery found * Continue CT to H2O seal.  Hopeful d/c n AM  LOS: 2 days    Derotha Fishbaugh C 03/24/2012

## 2012-03-25 ENCOUNTER — Inpatient Hospital Stay (HOSPITAL_COMMUNITY): Payer: Self-pay

## 2012-03-25 NOTE — H&P (Signed)
Travis Allen is an 31 y.o. male.   Chief Complaint: Chest pain and shortness of breath. HPI: Patient presented to [facility] emergency department with less than 24 hours of increasing shortness of breath and chest pain localized to the right upper chest. Patient has had similar symptomatology earlier this year on the left side and was noted that time have a pneumothorax. On evaluation in the emergency department was noted that he did have a pneumothorax on the right side and surgical consultation was obtained. Patient denies any trauma. No recent history of coughing. Patient does continue to smoke. He denies any fevers or chills.  Past Medical History  Diagnosis Date  . Pneumothorax on left     Past Surgical History  Procedure Date  . Chest tube insertion     History reviewed. No pertinent family history. Social History:  reports that he has been smoking Cigarettes.  He has a 21 pack-year smoking history. He does not have any smokeless tobacco history on file. He reports that he drinks alcohol. He reports that he does not use illicit drugs.  Allergies: No Known Allergies  Medications Prior to Admission  Medication Sig Dispense Refill  . acetaminophen (TYLENOL) 500 MG tablet Take 500-1,000 mg by mouth as needed. For pain      . ibuprofen (ADVIL,MOTRIN) 200 MG tablet Take 200-400 mg by mouth as needed. For pain        Results for orders placed during the hospital encounter of 03/22/12 (from the past 48 hour(s))  GLUCOSE, CAPILLARY     Status: Normal   Collection Time   03/23/12  3:44 PM      Component Value Range Comment   Glucose-Capillary 80  70 - 99 mg/dL    Dg Chest Port 1 View  03/25/2012  *RADIOLOGY REPORT*  Clinical Data: Right pneumothorax, follow-up  PORTABLE CHEST - 1 VIEW  Comparison: Portable chest x-ray of 03/24/2012  Findings:  There is little change in the small right apical pneumothorax with right chest tube remaining.  The lungs appear somewhat hyperaerated.  No  effusion is seen.  Mediastinal contours are stable.  Heart size is stable.  IMPRESSION: No change in small right apical pneumothorax.   Original Report Authenticated By: Juline Patch, M.D.    Dg Chest Port 1 View  03/24/2012  *RADIOLOGY REPORT*  Clinical Data: Right pneumothorax, follow-up  PORTABLE CHEST - 1 VIEW  Comparison: Portable exam 0620 hours compared to 03/23/2012  Findings: Small bore right thoracostomy tube stable. Normal heart size, mediastinal contours, and pulmonary vascularity. Small right apex pneumothorax slightly decreased. Lungs are emphysematous but otherwise clear. No pleural effusion or acute bony findings.  IMPRESSION: Small right apex pneumothorax slightly decreased.   Original Report Authenticated By: Lollie Marrow, M.D.     Review of Systems  Constitutional: Negative for fever, chills, weight loss, malaise/fatigue and diaphoresis.  HENT: Negative.   Eyes: Negative.   Respiratory: Positive for cough and shortness of breath. Negative for hemoptysis, sputum production and wheezing.   Cardiovascular: Positive for chest pain. Negative for palpitations, orthopnea and claudication.  Gastrointestinal: Negative.   Genitourinary: Negative.   Musculoskeletal: Negative.   Skin: Negative.   Neurological: Negative.  Negative for weakness.  Endo/Heme/Allergies: Negative.   Psychiatric/Behavioral: Negative.     Blood pressure 110/70, pulse 66, temperature 97.7 F (36.5 C), temperature source Oral, resp. rate 18, height 6\' 4"  (1.93 m), weight 65 kg (143 lb 4.8 oz), SpO2 98.00%. Physical Exam  Constitutional: He  is oriented to person, place, and time. He appears well-developed and well-nourished. No distress.       Thin  HENT:  Head: Normocephalic and atraumatic.  Eyes: Conjunctivae and EOM are normal. Pupils are equal, round, and reactive to light. No scleral icterus.  Neck: Normal range of motion. Neck supple. No tracheal deviation present. No thyromegaly present.    Cardiovascular: Normal rate, regular rhythm and normal heart sounds.   Respiratory: Effort normal. No respiratory distress.  GI: Soft. Bowel sounds are normal. He exhibits no distension. There is no tenderness.  Lymphadenopathy:    He has no cervical adenopathy.  Neurological: He is alert and oriented to person, place, and time.  Skin: Skin is warm and dry.     Assessment/Plan Right-sided pneumothorax. Findings were discussed with the patient. Surgical options were discussed including both pneumo-cath placement versus formal thoracostomy tube placement. Patient does wish to proceed with a pneumocath and replacement we'll plan to proceed at this time. Following placement of the catheter patient will be admitted for continued management of his pneumothorax. Again the etiology was discussed with the patient and patient express understanding. Patient is in agreement with the plan.  Emmalene Kattner C 03/25/2012, 9:53 AM

## 2012-03-25 NOTE — Progress Notes (Signed)
Chest tube removed by MD. IV removed, site WNL.  Pt given d/c instructions discussed home care and care of chest tube removal site with patient and discussed home medications, patient verbalizes understanding, teachback completed. F/U appointment in place, pt states they will keep appointment. Pt is stable at this time.

## 2012-03-25 NOTE — Progress Notes (Signed)
UR chart review completed.  

## 2012-03-25 NOTE — Care Management Note (Signed)
    Page 1 of 1   03/25/2012     12:01:14 PM   CARE MANAGEMENT NOTE 03/25/2012  Patient:  Travis Allen, Travis Allen   Account Number:  [id number]  Date Initiated:  03/25/2012  Documentation initiated by:  Sharrie Rothman  Subjective/Objective Assessment:   Pt admitted from home with spontaneous pneumothorax. Pt lives with his father and will return home at discharge.     Action/Plan:   Pt discharged today. No CM/HH needs. Financial counselor is aware of self pay status. Pt stated that his family would help him with medications.   Anticipated DC Date:  04/01/2012   Anticipated DC Plan:  HOME/SELF CARE  In-house referral  Financial Counselor      DC Planning Services  CM consult      Choice offered to / List presented to:             Status of service:  Completed, signed off Medicare Important Message given?   (If response is "NO", the following Medicare IM given date fields will be blank) Date Medicare IM given:   Date Additional Medicare IM given:    Discharge Disposition:  HOME/SELF CARE  Per UR Regulation:    If discussed at Long Length of Stay Meetings, dates discussed:    Comments:  03/25/12 1200 Arlyss Queen, RN BSN CM

## 2012-03-27 NOTE — Discharge Summary (Signed)
Physician Discharge Summary  Patient ID: Travis Allen MRN: [id number] DOB/AGE: Feb 10, 1981 31 y.o.  Admit date: 03/22/2012 Discharge date: 03/27/2012  Admission Diagnoses: Spontaneous right pneumothorax  Discharge Diagnoses: The same Active Problems:  * No active hospital problems. *    Discharged Condition: stable  Hospital Course: Patient presented to [facility] emergency department with right upper chest pain and shortness of breath. This was consistent with a similar symptomatology in the left pneumothorax earlier this year. Workup and evaluation was consistent for a right-sided pneumothorax. A pneumocath was placed in the emergency department. Patient was admitted for continued management of the pneumothorax. He did have resolution of the air leak. Monitoring on waterseal demonstrated no evidence of accumulation with the pneumothorax. At this point the pneumatic cath was removed and an occlusive dressing was placed. Patient was made ready for discharge. Patient understands reasons to return to the emergency department.  Consults: None  Significant Diagnostic Studies: radiology: CXR: Daily for monitoring of the pneumothorax  Treatments: IV hydration and chest tube placement (pneumocath)  Discharge Exam: Blood pressure 110/70, pulse 66, temperature 97.7 F (36.5 C), temperature source Oral, resp. rate 18, height 6\' 4"  (1.93 m), weight 65 kg (143 lb 4.8 oz), SpO2 98.00%. General appearance: alert and no distress Resp: clear to auscultation bilaterally Cardio: regular rate and rhythm GI: soft, non-tender; bowel sounds normal; no masses,  no organomegaly Extremities: extremities normal, atraumatic, no cyanosis or edema  Disposition: 01-Home or Self Care  Discharge Orders    Future Orders Please Complete By Expires   Diet - low sodium heart healthy      Increase activity slowly      Discharge instructions      Comments:   Increase activity as tolerated.  May shower in AM.  May  place band-aid for any drainage.  Keep site clean with soap and water.   Call MD for:  temperature >100.4      Call MD for:  persistant nausea and vomiting      Call MD for:  difficulty breathing, headache or visual disturbances      Call MD for:  redness, tenderness, or signs of infection (pain, swelling, redness, odor or green/yellow discharge around incision site)      Call MD for:  severe uncontrolled pain        Medication List  As of 03/27/2012  9:13 AM   TAKE these medications         acetaminophen 500 MG tablet   Commonly known as: TYLENOL   Take 500-1,000 mg by mouth as needed. For pain      ibuprofen 200 MG tablet   Commonly known as: ADVIL,MOTRIN   Take 200-400 mg by mouth as needed. For pain             Signed: Anniyah Mood C 03/27/2012, 9:13 AM

## 2012-06-03 ENCOUNTER — Inpatient Hospital Stay (HOSPITAL_COMMUNITY)
Admission: EM | Admit: 2012-06-03 | Discharge: 2012-06-10 | DRG: 165 | Disposition: A | Discharge status: Home / Self Care | Payer: MEDICAID | Attending: Emergency Medicine | Admitting: Emergency Medicine

## 2012-06-03 ENCOUNTER — Emergency Department (HOSPITAL_COMMUNITY): Payer: Self-pay

## 2012-06-03 ENCOUNTER — Encounter (HOSPITAL_COMMUNITY): Payer: Self-pay | Admitting: *Deleted

## 2012-06-03 DIAGNOSIS — J9383 Other pneumothorax: Principal | ICD-10-CM | POA: Diagnosis present

## 2012-06-03 DIAGNOSIS — J439 Emphysema, unspecified: Secondary | ICD-10-CM | POA: Diagnosis present

## 2012-06-03 DIAGNOSIS — J939 Pneumothorax, unspecified: Secondary | ICD-10-CM

## 2012-06-03 DIAGNOSIS — F17200 Nicotine dependence, unspecified, uncomplicated: Secondary | ICD-10-CM | POA: Diagnosis present

## 2012-06-03 DIAGNOSIS — Z9889 Other specified postprocedural states: Secondary | ICD-10-CM

## 2012-06-03 LAB — BASIC METABOLIC PANEL
BUN: 10 mg/dL (ref 6–23)
CO2: 27 mEq/L (ref 19–32)
Glucose, Bld: 96 mg/dL (ref 70–99)
Potassium: 4 mEq/L (ref 3.5–5.1)
Sodium: 137 mEq/L (ref 135–145)

## 2012-06-03 LAB — CBC
HCT: 40.1 % (ref 39.0–52.0)
Hemoglobin: 13.9 g/dL (ref 13.0–17.0)
MCH: 29.9 pg (ref 26.0–34.0)
MCHC: 34.7 g/dL (ref 30.0–36.0)
RBC: 4.65 MIL/uL (ref 4.22–5.81)

## 2012-06-03 MED ORDER — LIDOCAINE HCL (PF) 1 % IJ SOLN
INTRAMUSCULAR | Status: AC
  Filled 2012-06-03: qty 15

## 2012-06-03 MED ORDER — LIDOCAINE-EPINEPHRINE (PF) 2 %-1:200000 IJ SOLN
INTRAMUSCULAR | Status: AC
  Administered 2012-06-04: 20 mL
  Filled 2012-06-03: qty 20

## 2012-06-03 MED ORDER — MORPHINE SULFATE 4 MG/ML IJ SOLN
4.0000 mg | Freq: Once | INTRAMUSCULAR | Status: AC
  Administered 2012-06-03: 4 mg via INTRAVENOUS

## 2012-06-03 MED ORDER — MORPHINE SULFATE 4 MG/ML IJ SOLN
INTRAMUSCULAR | Status: AC
  Filled 2012-06-03: qty 1

## 2012-06-03 MED ORDER — MORPHINE SULFATE 4 MG/ML IJ SOLN
4.0000 mg | Freq: Once | INTRAMUSCULAR | Status: AC
  Administered 2012-06-03: 4 mg via INTRAVENOUS
  Filled 2012-06-03 (×2): qty 1

## 2012-06-03 MED ORDER — MIDAZOLAM HCL 2 MG/2ML IJ SOLN
2.0000 mg | Freq: Once | INTRAMUSCULAR | Status: AC
  Administered 2012-06-03: 2 mg via INTRAVENOUS
  Filled 2012-06-03: qty 2

## 2012-06-03 NOTE — ED Provider Notes (Signed)
History   This chart was scribed for Lyanne Co, MD by Sofie Rower, ED Scribe. The patient was seen in room [facility] and the patient's care was started at 6:46PM.     CSN: [id number]  Arrival date & time 06/03/12  1842   First MD Initiated Contact with Patient 06/03/12 1846      Chief Complaint  Patient presents with  . Shortness of Breath  . Chest Pain     The history is provided by the patient.    Travis Allen is a 31 y.o. male , with a hx of pneumothorax and chest tube insertion, who presents to the Emergency Department complaining of sudden, progressively worsening, chest pain located at the right side of the chest, onset three days ago (05/31/12).  Associated symptoms include shortness of breath. The pt reports he has been experiencing chest pains for the past three days, characterized as similar to those experienced with pneumothorax, however, his symptoms have refused to dissipate, prompting tonight's visit to [facility]. Modifying factors include deep breathing which intensifies the chest pain.  Denies cough or congestion.  He continues to smoke cigarettes.  He has had no fevers or chills.  He reports no recent trauma.  He has had a prior chest tubes placed both to his left and his right  The pt denies fever, cough, and congestion.   The pt is a current everyday smoker (1.0 packs/day), in addition to drinking alcohol on an occasional basis.      Past Medical History  Diagnosis Date  . Pneumothorax on left     Past Surgical History  Procedure Date  . Chest tube insertion     No family history on file.  History  Substance Use Topics  . Smoking status: Current Every Day Smoker -- 1.0 packs/day for 21 years    Types: Cigarettes  . Smokeless tobacco: Not on file  . Alcohol Use: Yes     Comment: occasionally      Review of Systems  All other systems reviewed and are negative.    Allergies  Review of patient's allergies indicates no known  allergies.  Home Medications   Current Outpatient Rx  Name  Route  Sig  Dispense  Refill  . ACETAMINOPHEN 500 MG PO TABS   Oral   Take 500-1,000 mg by mouth as needed. For pain         . IBUPROFEN 200 MG PO TABS   Oral   Take 200-400 mg by mouth as needed. For pain           BP 112/70  Pulse 70  Resp 16  Ht 6\' 4"  (1.93 m)  Wt 143 lb (64.864 kg)  BMI 17.41 kg/m2  SpO2 100%  Physical Exam  Nursing note and vitals reviewed. Constitutional: He is oriented to person, place, and time. He appears well-developed and well-nourished.  HENT:  Head: Normocephalic and atraumatic.  Eyes: EOM are normal.  Neck: Normal range of motion.  Cardiovascular: Normal rate, regular rhythm, normal heart sounds and intact distal pulses.   Pulmonary/Chest: Effort normal and breath sounds normal. No respiratory distress.  Abdominal: Soft. He exhibits no distension. There is no tenderness.  Musculoskeletal: Normal range of motion.  Neurological: He is alert and oriented to person, place, and time.  Skin: Skin is warm and dry.  Psychiatric: He has a normal mood and affect. Judgment normal.    ED Course  CHEST TUBE INSERTION Performed by: Lyanne Co Authorized by:  Azalia Bilis M Consent: Verbal consent obtained. Risks and benefits: risks, benefits and alternatives were discussed Consent given by: patient Patient understanding: patient states understanding of the procedure being performed Site marked: the operative site was marked Imaging studies: imaging studies available Required items: required blood products, implants, devices, and special equipment available Patient identity confirmed: verbally with patient Time out: Immediately prior to procedure a "time out" was called to verify the correct patient, procedure, equipment, support staff and site/side marked as required. Indications: pneumothorax Patient sedated: yes Sedatives: diazepam Analgesia: morphine Sedation start  date/time: 06/03/2012 11:49 PM Sedation end date/time: 06/04/2012 12:17 AM Vitals: Vital signs were monitored during sedation. Anesthesia: local infiltration Local anesthetic: lidocaine 2% with epinephrine Anesthetic total: 14 ml Preparation: skin prepped with Betadine Placement location: right lateral Scalpel size: 15 Tube size: 28 [place] Dissection instrument: finger and Kelly clamp Tube connected to: suction Drainage amount: 0 ml Suture material: 0 silk Dressing: 4x4 sterile gauze Post-insertion x-ray findings: tube in good position Patient tolerance: Patient tolerated the procedure well with no immediate complications.     DIAGNOSTIC STUDIES: Oxygen Saturation is 100% on room air, normal by my interpretation.    COORDINATION OF CARE:  6:59PM- Treatment plan discussed with patient. Patient agrees with treatment.   7:50PM- Phone consultation with Dr. Tyrone Sage concerning pt. Condition, hx of pneumothorax, and possible chest tube placement. Dr. Tyrone Sage agrees with treatment.    8:10 PM- Recheck. Treatment plan concerning phone consultation with Dr. Tyrone Sage, repeat of x-ray, possible chest tube placement and hospital admission discussed with patient. Pt agrees with treatment.   8:12PM- Phone consultation. Message left with Dr. Leticia Penna concerning consultation with Dr. Tyrone Sage.   8:50 PM- Recheck. Treatment plan discussed with patient. Pt agrees with treatment.   9:33PM- Recheck. Treatment plan concerning repeat of x-ray discussed with patient. Pt agrees with treatment.   10:47 PM- recheck. Treatment plan concerning insertion of chest tube discussed with patient. Pt agrees with treatment.          Labs Reviewed  CBC  BASIC METABOLIC PANEL   Dg Chest 2 View  06/03/2012  *RADIOLOGY REPORT*  Clinical Data: reevaluate hydropneumothorax.  CHEST - 2 VIEW  Comparison: 06/03/2012  Findings: Normal heart size.  There is a right-sided hydropneumothorax.  This is unchanged  in volume from previous exam. Slight increase in atelectatic changes in the right lower lobe.  No airspace consolidation.  Left lung is clear.  IMPRESSION:  1.  Stable volume of right lower lobe hydropneumothorax. 2.  Increasing atelectatic changes in the right lower lobe.   Original Report Authenticated By: Signa Kell, M.D.    Dg Chest 2 View  06/03/2012  *RADIOLOGY REPORT*  Clinical Data: Right-sided chest pain.  History of pneumothorax.  CHEST - 2 VIEW  Comparison: 03/26/2012  Findings: The left chest is clear.  On the right, there is a hydropneumothorax.  Degree of collapse is approximately 15-20%.  No sign of tension.  Heart size is normal.  Question emphysema.  IMPRESSION: There are a hydropneumothorax on the right, approximately 15-20%. No sign of tension.   Original Report Authenticated By: Paulina Fusi, M.D.    Dg Chest Portable 1 View  06/04/2012  *RADIOLOGY REPORT*  Clinical Data: Shortness of breath, chest pain.  PORTABLE CHEST - 1 VIEW  Comparison: 06/03/2012  Findings: Interval placement of right chest tube.  Decreasing right pneumothorax, now small, less than 5%.  Hyperinflation of the lungs.  Heart is normal size.  No confluent opacity or effusion.  IMPRESSION: Near complete resolution of the right pneumothorax following chest tube placement.   Original Report Authenticated By: Charlett Nose, M.D.      I personally reviewed the imaging tests through PACS system I reviewed available ER/hospitalization records through the EMR   Date: 06/03/2012  Rate: 62  Rhythm: normal sinus rhythm  QRS Axis: normal  Intervals: normal  ST/T Wave abnormalities: normal  Conduction Disutrbances: none  Narrative Interpretation:   Old EKG Reviewed: No significant changes noted       1. Pneumothorax       MDM  This is a recurrent spontaneous pneumothorax for the patient.  I spoke with the general surgeon Dr. Leticia Penna who performed his prior chest tube N/A reported the fact that this is  recurrent he believes the patient would benefit from cardiothoracic surgery consultation.  I discussed the case with the CT surgeon Dr. Tyrone Sage who accepts the patient in transfer to [facility].  He agrees that the patient may benefit from number breather in the emergency department for several hours and a repeat chest x-ray to be obtained.  His recommendations are that if the repeat pneumothorax looks improved today he can be transferred down to [facility] without tube thoracostomy as the patient will likely just undergo need for VATS procedure.  His thoughts are that if the repeat chest x-ray is the same or worse that he would likely benefit from tube thoracostomy in the emergency department prior to transfer as the patient is symptomatic from this.  I monitor the patient for some time in the emergency department on nonrebreather and his repeat chest x-ray looks unchanged.  The patient continues to have occasional spasms of pain and given the fact that he continues to have pain and discomfort when taking a deep breath I beam the patient's symptomatic from this pneumothorax I believe at this point the benefit of tube thoracostomy outweighs the risk.  I discussed this at length with the patient was agreeable to move forward with chest tube placement.  The chest tube was placed without any difficulty.  The patient tolerated the procedure well.  He'll be transferred to a monitored bed at [facility] where he will be evaluated by the CT surgery team in the morning.  12:22 AM Repeat chest x-ray pending      I personally performed the services described in this documentation, which was scribed in my presence. The recorded information has been reviewed and is accurate.      Lyanne Co, MD 06/04/12 [phone]

## 2012-06-03 NOTE — ED Notes (Signed)
Reports right sided cp x 3 days with difficulty breathing.  Reports worsening pain with deep breath.  Hx of pneumothorax.

## 2012-06-04 ENCOUNTER — Inpatient Hospital Stay (HOSPITAL_COMMUNITY): Payer: Self-pay

## 2012-06-04 DIAGNOSIS — J930 Spontaneous tension pneumothorax: Secondary | ICD-10-CM

## 2012-06-04 LAB — COMPREHENSIVE METABOLIC PANEL
ALT: 26 U/L (ref 0–53)
AST: 28 U/L (ref 0–37)
Albumin: 2.8 g/dL — ABNORMAL LOW (ref 3.5–5.2)
Alkaline Phosphatase: 74 U/L (ref 39–117)
BUN: 7 mg/dL (ref 6–23)
CO2: 28 mEq/L (ref 19–32)
Calcium: 8.4 mg/dL (ref 8.4–10.5)
Chloride: 104 mEq/L (ref 96–112)
Creatinine, Ser: 0.92 mg/dL (ref 0.50–1.35)
GFR calc Af Amer: >90 mL/min (ref >90)
GFR calc non Af Amer: >90 mL/min (ref >90)
Glucose, Bld: 104 mg/dL — ABNORMAL HIGH (ref 70–99)
Potassium: 3.6 mEq/L (ref 3.5–5.1)
Sodium: 137 mEq/L (ref 135–145)
Total Bilirubin: 0.4 mg/dL (ref 0.3–1.2)
Total Protein: 5.6 g/dL — ABNORMAL LOW (ref 6.0–8.3)

## 2012-06-04 LAB — PROTIME-INR
INR: 1.19 (ref 0.00–1.49)
Prothrombin Time: 14.9 seconds (ref 11.6–15.2)

## 2012-06-04 LAB — CBC
HCT: 36.3 % — ABNORMAL LOW (ref 39.0–52.0)
Hemoglobin: 12.5 g/dL — ABNORMAL LOW (ref 13.0–17.0)
MCH: 29.7 pg (ref 26.0–34.0)
MCHC: 34.4 g/dL (ref 30.0–36.0)
MCV: 86.2 fL (ref 78.0–100.0)
Platelets: 196 10*3/uL (ref 150–400)
RBC: 4.21 MIL/uL — ABNORMAL LOW (ref 4.22–5.81)
RDW: 12.6 % (ref 11.5–15.5)
WBC: 5.1 10*3/uL (ref 4.0–10.5)

## 2012-06-04 LAB — SURGICAL PCR SCREEN
MRSA, PCR: NEGATIVE
Staphylococcus aureus: NEGATIVE

## 2012-06-04 LAB — BLOOD GAS, ARTERIAL
Acid-Base Excess: 1.4 mmol/L (ref 0.0–2.0)
Bicarbonate: 25.4 mEq/L — ABNORMAL HIGH (ref 20.0–24.0)
Drawn by: 10006
FIO2: 0.21 %
O2 Saturation: 97.6 %
Patient temperature: 98.6
TCO2: 26.7 mmol/L (ref 0–100)
pCO2 arterial: 40.3 mmHg (ref 35.0–45.0)
pH, Arterial: 7.417 (ref 7.350–7.450)
pO2, Arterial: 96.9 mmHg (ref 80.0–100.0)

## 2012-06-04 LAB — URINALYSIS, ROUTINE W REFLEX MICROSCOPIC
Bilirubin Urine: NEGATIVE
Glucose, UA: NEGATIVE mg/dL
Hgb urine dipstick: NEGATIVE
Ketones, ur: NEGATIVE mg/dL
Leukocytes, UA: NEGATIVE
Nitrite: NEGATIVE
Protein, ur: NEGATIVE mg/dL
Specific Gravity, Urine: 1.025 (ref 1.005–1.030)
Urobilinogen, UA: 1 mg/dL (ref 0.0–1.0)
pH: 7.5 (ref 5.0–8.0)

## 2012-06-04 LAB — TYPE AND SCREEN
ABO/RH(D): A POS
Antibody Screen: NEGATIVE

## 2012-06-04 LAB — APTT: aPTT: 37 seconds (ref 24–37)

## 2012-06-04 LAB — ABO/RH: ABO/RH(D): A POS

## 2012-06-04 MED ORDER — IOHEXOL 300 MG/ML  SOLN
70.0000 mL | Freq: Once | INTRAMUSCULAR | Status: AC | PRN
  Administered 2012-06-04: 70 mL via INTRAVENOUS

## 2012-06-04 MED ORDER — OXYCODONE-ACETAMINOPHEN 5-325 MG PO TABS
1.0000 | ORAL_TABLET | ORAL | Status: DC | PRN
  Administered 2012-06-04: 1 via ORAL
  Filled 2012-06-04: qty 1

## 2012-06-04 MED ORDER — SODIUM CHLORIDE 0.9 % IV SOLN
INTRAVENOUS | Status: AC
  Administered 2012-06-04 (×2): via INTRAVENOUS

## 2012-06-04 MED ORDER — MORPHINE SULFATE 4 MG/ML IJ SOLN
6.0000 mg | INTRAMUSCULAR | Status: DC | PRN
  Administered 2012-06-04: 4 mg via INTRAVENOUS
  Administered 2012-06-04: 6 mg via INTRAVENOUS
  Filled 2012-06-04 (×3): qty 1

## 2012-06-04 MED ORDER — BUDESONIDE-FORMOTEROL FUMARATE 160-4.5 MCG/ACT IN AERO
2.0000 | INHALATION_SPRAY | Freq: Two times a day (BID) | RESPIRATORY_TRACT | Status: DC
  Administered 2012-06-05 – 2012-06-10 (×10): 2 via RESPIRATORY_TRACT
  Filled 2012-06-04 (×2): qty 6

## 2012-06-04 MED ORDER — CEFUROXIME SODIUM 1.5 G IJ SOLR
1.5000 g | INTRAMUSCULAR | Status: AC
  Administered 2012-06-05: 1.5 g via INTRAVENOUS
  Filled 2012-06-04: qty 1.5

## 2012-06-04 NOTE — Progress Notes (Signed)
Nutrition Brief Note  Patient identified due to Body mass index is 17.39 kg/(m^2).  Pt meets criteria for Underweight based on current BMI.   Current diet order is Heart Healthy. Patient reports a good appetite and is consuming approximately 75% of meals at this time. Declined addition of snacks and/or nutrition supplements.  Labs and medications reviewed.   No nutrition interventions warranted at this time. If nutrition issues arise, please consult RD.   Kirkland Hun, RD, LDN Pager #: [phone] After-Hours Pager #: [phone]

## 2012-06-04 NOTE — ED Notes (Signed)
Report given to [facility] 

## 2012-06-04 NOTE — Progress Notes (Signed)
PA paged at this time regarding pain meds; only PRN ordered is IV Morphine; will await callback.

## 2012-06-04 NOTE — Progress Notes (Signed)
PA returned page; new pain meds will be ordered; will administer when available.

## 2012-06-04 NOTE — H&P (Signed)
[address]            [place] [address]          [phone]     Travis Allen is an 31 y.o. male.  @bday  WGN:[id number] Chief Complaint: Shortness of breath HPI: Patient is a 31 year old male with a previous history of spontaneous pneumothorax and chest tube insertion who presented to the emergency department at any pain hospital with complaints of shortness of breath and chest discomfort on the right side. The symptoms began approximately 3 days prior to presentation. The patient notes the symptoms are worse with deep inspiration. He denies cough or respiratory congestion. He does smoke cigarettes. He denied fevers chills or other constitutional symptoms. He has had no recent trauma. He has had previous chest tubes on both the left and right side.  Past Medical History  Diagnosis Date  . Pneumothorax on left    pneumothorax on the right  Past Surgical History  Procedure Date  . Chest tube insertion     No family history on file. Social History:  reports that he has been smoking Cigarettes.  He has a 21 pack-year smoking history. He does not have any smokeless tobacco history on file. He reports that he drinks alcohol. He reports that he does not use illicit drugs.  Allergies: No Known Allergies  No prescriptions prior to admission    Results for orders placed during the hospital encounter of 06/03/12 (from the past 48 hour(s))  CBC     Status: Normal   Collection Time   06/03/12  9:49 PM      Component Value Range Comment   WBC 9.3  4.0 - 10.5 K/uL    RBC 4.65  4.22 - 5.81 MIL/uL    Hemoglobin 13.9  13.0 - 17.0 g/dL    HCT 78.4  69.6 - 29.5 %    MCV 86.2  78.0 - 100.0 fL    MCH 29.9  26.0 - 34.0 pg    MCHC 34.7  30.0 - 36.0 g/dL    RDW 28.4  13.2 - 44.0 %    Platelets 204  150 - 400 K/uL   BASIC METABOLIC PANEL     Status: Normal   Collection Time   06/03/12  9:49 PM      Component Value Range Comment   Sodium 137  135 - 145  mEq/L    Potassium 4.0  3.5 - 5.1 mEq/L    Chloride 104  96 - 112 mEq/L    CO2 27  19 - 32 mEq/L    Glucose, Bld 96  70 - 99 mg/dL    BUN 10  6 - 23 mg/dL    Creatinine, Ser 1.02  0.50 - 1.35 mg/dL    Calcium 9.0  8.4 - 72.5 mg/dL    GFR calc non Af Amer >90  >90 mL/min    GFR calc Af Amer >90  >90 mL/min    Dg Chest 2 View  06/03/2012  *RADIOLOGY REPORT*  Clinical Data: reevaluate hydropneumothorax.  CHEST - 2 VIEW  Comparison: 06/03/2012  Findings: Normal heart size.  There is a right-sided hydropneumothorax.  This is unchanged in volume from previous exam. Slight increase in atelectatic changes in the right lower lobe.  No airspace consolidation.  Left lung is clear.  IMPRESSION:  1.  Stable volume of right lower lobe hydropneumothorax.  2.  Increasing atelectatic changes in the right lower lobe.   Original Report Authenticated By: Signa Kell, M.D.    Dg Chest 2 View  06/03/2012  *RADIOLOGY REPORT*  Clinical Data: Right-sided chest pain.  History of pneumothorax.  CHEST - 2 VIEW  Comparison: 03/26/2012  Findings: The left chest is clear.  On the right, there is a hydropneumothorax.  Degree of collapse is approximately 15-20%.  No sign of tension.  Heart size is normal.  Question emphysema.  IMPRESSION: There are a hydropneumothorax on the right, approximately 15-20%. No sign of tension.   Original Report Authenticated By: Paulina Fusi, M.D.    Dg Chest Portable 1 View  06/04/2012  *RADIOLOGY REPORT*  Clinical Data: Shortness of breath, chest pain.  PORTABLE CHEST - 1 VIEW  Comparison: 06/03/2012  Findings: Interval placement of right chest tube.  Decreasing right pneumothorax, now small, less than 5%.  Hyperinflation of the lungs.  Heart is normal size.  No confluent opacity or effusion.  IMPRESSION: Near complete resolution of the right pneumothorax following chest tube placement.   Original Report Authenticated By: Charlett Nose, M.D.    Review of Systems -otherwise unremarkable with the  exception of: 1. Occasional diarrhea 2. Mild intermittent depression symptoms    Blood pressure 112/68, pulse 74, temperature 98.3 F (36.8 C), resp. rate 18, height 6\' 4"  (1.93 m), weight 142 lb 13.7 oz (64.8 kg), SpO2 96.00%.Physical Examination: General appearance - alert, well appearing, and in no distress Eyes - pupils equal and reactive, extraocular eye movements intact Ears - bilateral TM's and external ear canals normal, not examined Nose - normal and patent, no erythema, discharge or polyps and not examined Mouth - mucous membranes moist, pharynx normal without lesions Neck - supple, no significant adenopathy Lymphatics - no palpable lymphadenopathy, no hepatosplenomegaly Chest - clear to auscultation, no wheezes, rales or rhonchi, symmetric air entry Heart - normal rate, regular rhythm, normal S1, S2, no murmurs, rubs, clicks or gallops Abdomen - soft, nontender, nondistended, no masses or organomegaly GU Male - deferred Rectal - deferred Neurological - alert, oriented, normal speech, no focal findings or movement disorder noted Musculoskeletal - no joint tenderness, deformity or swelling Extremities - peripheral pulses normal, no pedal edema, no clubbing or cyanosis Skin - normal coloration and turgor, no rashes, no suspicious skin lesions noted   Assessment/Plan Recurrent spontaneous right pneumothorax. A chest tube was placed in the emergency department. The lung is now expanded. We will continue chest tube to suction and follow closely. Additionally we will obtain a CT scan of the chest to fully evaluate anatomy as he may require video-assisted thoracoscopic surgical repair of emphysematous blebs.  We did discuss tobacco cessation and he appears amenable to the idea of quitting cigarettes. He does appear to have a good understanding of the impacted is making on the current clinical condition as well as potential harm in the future.    GOLD,WAYNE E 06/04/2012, 9:00  AM    I have seen and examined Travis Allen and agree with the above assessment  and plan.  Delight Ovens MD Beeper [phone] Office [phone] 06/04/2012 3:07 PM

## 2012-06-04 NOTE — Progress Notes (Signed)
Utilization Review Completed.Travis Allen T11/14/2013   

## 2012-06-04 NOTE — Progress Notes (Signed)
Procedure(s) (LRB): VIDEO ASSISTED THORACOSCOPY (Right) Subjective Smoker with recurrent right spontaneous pneumothorax status post chest tube placement last p.m. at [facility] hospital ED CT scan of the chest shows apical blebs on right side Plan right VATS resection of blebs and pleurodesed is tomorrow-procedure discussed with patient including use of general anesthesia, location of the incisions, and expected postop recovery  Objective: Vital signs in last 24 hours: Temp:  [98.1 F (36.7 C)-98.4 F (36.9 C)] 98.4 F (36.9 C) (11/14 1335) Pulse Rate:  [59-84] 75  (11/14 1335) Cardiac Rhythm:  [-] Normal sinus rhythm (11/14 0745) Resp:  [12-21] 18  (11/14 1335) BP: (95-117)/(47-74) 117/73 mmHg (11/14 1335) SpO2:  [96 %-100 %] 97 % (11/14 1335) Weight:  [135 lb 2.3 oz (61.3 kg)-142 lb 13.7 oz (64.8 kg)] 142 lb 13.7 oz (64.8 kg) (11/14 0744)  Hemodynamic parameters for last 24 hours:   normal sinus rhythm  Intake/Output from previous day:   Intake/Output this shift: Total I/O In: 2085 [P.O.:960; I.V.:1125] Out: 1500 [Urine:1500]  Chest tube in place Good breath sounds bilaterally Cardiac rhythm regular No crepitus in the neck  Lab Results:  [facility] 06/04/12 1509 06/03/12 2149  WBC 5.1 9.3  HGB 12.5* 13.9  HCT 36.3* 40.1  PLT 196 204   BMET:  Basename 06/04/12 1509 06/03/12 2149  NA 137 137  K 3.6 4.0  CL 104 104  CO2 28 27  GLUCOSE 104* 96  BUN 7 10  CREATININE 0.92 0.90  CALCIUM 8.4 9.0    PT/INR:  Basename 06/04/12 1509  LABPROT 14.9  INR 1.19   ABG No results found for this basename: phart, pco2, po2, hco3, tco2, acidbasedef, o2sat   CBG (last 3)  No results found for this basename: GLUCAP:3 in the last 72 hours  Assessment/Plan: S/P Procedure(s) (LRB): VIDEO ASSISTED THORACOSCOPY (Right) Plan right VATS for recurrent spontaneous pneumothorax tomorrow   LOS: 1 day    VAN TRIGT III,PETER 06/04/2012

## 2012-06-05 ENCOUNTER — Encounter (HOSPITAL_COMMUNITY): Payer: Self-pay | Admitting: *Deleted

## 2012-06-05 ENCOUNTER — Encounter (HOSPITAL_COMMUNITY)
Admission: EM | Disposition: A | Discharge status: Home / Self Care | Payer: Self-pay | Source: Home / Self Care | Attending: Cardiothoracic Surgery

## 2012-06-05 ENCOUNTER — Inpatient Hospital Stay (HOSPITAL_COMMUNITY): Payer: MEDICAID

## 2012-06-05 ENCOUNTER — Inpatient Hospital Stay (HOSPITAL_COMMUNITY): Payer: MEDICAID | Admitting: *Deleted

## 2012-06-05 DIAGNOSIS — J930 Spontaneous tension pneumothorax: Secondary | ICD-10-CM

## 2012-06-05 HISTORY — PX: RESECTION OF APICAL BLEB: SHX5078

## 2012-06-05 HISTORY — PX: VIDEO ASSISTED THORACOSCOPY: SHX5073

## 2012-06-05 HISTORY — PX: PLEURADESIS: SHX6030

## 2012-06-05 SURGERY — VIDEO ASSISTED THORACOSCOPY
Anesthesia: General | Site: Chest | Laterality: Right | Wound class: Clean Contaminated

## 2012-06-05 MED ORDER — POTASSIUM CHLORIDE 10 MEQ/50ML IV SOLN
10.0000 meq | Freq: Every day | INTRAVENOUS | Status: DC | PRN
  Administered 2012-06-06 (×3): 10 meq via INTRAVENOUS
  Filled 2012-06-05: qty 50
  Filled 2012-06-05: qty 100

## 2012-06-05 MED ORDER — HYDROMORPHONE HCL PF 1 MG/ML IJ SOLN
INTRAMUSCULAR | Status: AC
  Administered 2012-06-05: 0.5 mg via INTRAVENOUS
  Filled 2012-06-05: qty 1

## 2012-06-05 MED ORDER — HYDROMORPHONE HCL PF 1 MG/ML IJ SOLN
0.2500 mg | INTRAMUSCULAR | Status: DC | PRN
  Administered 2012-06-05 (×2): 0.5 mg via INTRAVENOUS

## 2012-06-05 MED ORDER — ROCURONIUM BROMIDE 100 MG/10ML IV SOLN
INTRAVENOUS | Status: DC | PRN
  Administered 2012-06-05: 50 mg via INTRAVENOUS

## 2012-06-05 MED ORDER — OXYCODONE HCL 5 MG PO TABS: 5.0000 mg | ORAL_TABLET | ORAL | Status: AC | PRN

## 2012-06-05 MED ORDER — ONDANSETRON HCL 4 MG/2ML IJ SOLN: 4.0000 mg | Freq: Four times a day (QID) | INTRAMUSCULAR | Status: DC | PRN

## 2012-06-05 MED ORDER — NALOXONE HCL 0.4 MG/ML IJ SOLN
0.4000 mg | INTRAMUSCULAR | Status: DC | PRN
  Filled 2012-06-05: qty 1

## 2012-06-05 MED ORDER — OXYCODONE HCL 5 MG/5ML PO SOLN: 5.0000 mg | Freq: Once | ORAL | Status: DC | PRN

## 2012-06-05 MED ORDER — SODIUM CHLORIDE 0.9 % IJ SOLN: 9.0000 mL | INTRAMUSCULAR | Status: DC | PRN

## 2012-06-05 MED ORDER — MIDAZOLAM HCL 2 MG/2ML IJ SOLN
INTRAMUSCULAR | Status: AC
  Filled 2012-06-05: qty 2

## 2012-06-05 MED ORDER — BISACODYL 5 MG PO TBEC
10.0000 mg | DELAYED_RELEASE_TABLET | Freq: Every day | ORAL | Status: DC
  Administered 2012-06-05 – 2012-06-10 (×6): 10 mg via ORAL
  Filled 2012-06-05 (×5): qty 2

## 2012-06-05 MED ORDER — ACETAMINOPHEN 10 MG/ML IV SOLN
1000.0000 mg | Freq: Four times a day (QID) | INTRAVENOUS | Status: AC
  Administered 2012-06-05 – 2012-06-06 (×4): 1000 mg via INTRAVENOUS
  Filled 2012-06-05 (×4): qty 100

## 2012-06-05 MED ORDER — KCL IN DEXTROSE-NACL 20-5-0.45 MEQ/L-%-% IV SOLN
INTRAVENOUS | Status: DC
  Administered 2012-06-05: 22:00:00 via INTRAVENOUS
  Filled 2012-06-05 (×7): qty 1000

## 2012-06-05 MED ORDER — GLYCOPYRROLATE 0.2 MG/ML IJ SOLN
INTRAMUSCULAR | Status: DC | PRN
  Administered 2012-06-05: 0.6 mg via INTRAVENOUS

## 2012-06-05 MED ORDER — FENTANYL CITRATE 0.05 MG/ML IJ SOLN
INTRAMUSCULAR | Status: DC | PRN
  Administered 2012-06-05 (×2): 100 ug via INTRAVENOUS
  Administered 2012-06-05: 50 ug via INTRAVENOUS

## 2012-06-05 MED ORDER — DIPHENHYDRAMINE HCL 12.5 MG/5ML PO ELIX
12.5000 mg | ORAL_SOLUTION | Freq: Four times a day (QID) | ORAL | Status: DC | PRN
  Filled 2012-06-05: qty 5

## 2012-06-05 MED ORDER — LIDOCAINE HCL (CARDIAC) 20 MG/ML IV SOLN
INTRAVENOUS | Status: DC | PRN
  Administered 2012-06-05: 80 mg via INTRAVENOUS

## 2012-06-05 MED ORDER — PHENYLEPHRINE HCL 10 MG/ML IJ SOLN
INTRAMUSCULAR | Status: DC | PRN
  Administered 2012-06-05 (×2): 80 ug via INTRAVENOUS
  Administered 2012-06-05: 40 ug via INTRAVENOUS
  Administered 2012-06-05 (×2): 80 ug via INTRAVENOUS
  Administered 2012-06-05: 40 ug via INTRAVENOUS

## 2012-06-05 MED ORDER — FENTANYL 10 MCG/ML IV SOLN
INTRAVENOUS | Status: DC
  Administered 2012-06-05: 19:00:00 via INTRAVENOUS
  Administered 2012-06-06: 15 ug via INTRAVENOUS
  Administered 2012-06-06: 30 ug via INTRAVENOUS
  Administered 2012-06-06 (×2): 15 ug via INTRAVENOUS
  Administered 2012-06-06: 30 ug via INTRAVENOUS
  Administered 2012-06-06: 45 ug via INTRAVENOUS
  Administered 2012-06-07 (×3): 30 ug via INTRAVENOUS
  Administered 2012-06-07: 90 ug via INTRAVENOUS
  Administered 2012-06-07: 75 ug via INTRAVENOUS
  Administered 2012-06-07: 15 ug via INTRAVENOUS
  Administered 2012-06-08: 30 ug via INTRAVENOUS
  Administered 2012-06-08: 75 ug via INTRAVENOUS
  Administered 2012-06-08 (×3): 30 ug via INTRAVENOUS
  Administered 2012-06-08: 150 ug via INTRAVENOUS
  Administered 2012-06-09: 40 ug via INTRAVENOUS
  Administered 2012-06-09: 30 ug via INTRAVENOUS
  Administered 2012-06-09: 60 ug via INTRAVENOUS
  Filled 2012-06-05 (×3): qty 50

## 2012-06-05 MED ORDER — MIDAZOLAM HCL 5 MG/5ML IJ SOLN
INTRAMUSCULAR | Status: DC | PRN
  Administered 2012-06-05: 2 mg via INTRAVENOUS

## 2012-06-05 MED ORDER — PROPOFOL 10 MG/ML IV BOLUS
INTRAVENOUS | Status: DC | PRN
  Administered 2012-06-05: 170 mg via INTRAVENOUS
  Administered 2012-06-05: 200 mg via INTRAVENOUS

## 2012-06-05 MED ORDER — FENTANYL CITRATE 0.05 MG/ML IJ SOLN
INTRAMUSCULAR | Status: AC
  Filled 2012-06-05: qty 2

## 2012-06-05 MED ORDER — ONDANSETRON HCL 4 MG/2ML IJ SOLN
4.0000 mg | Freq: Four times a day (QID) | INTRAMUSCULAR | Status: DC | PRN
  Filled 2012-06-05: qty 2

## 2012-06-05 MED ORDER — LACTATED RINGERS IV SOLN
INTRAVENOUS | Status: DC
  Administered 2012-06-05: 15:00:00 via INTRAVENOUS

## 2012-06-05 MED ORDER — ONDANSETRON HCL 4 MG/2ML IJ SOLN
INTRAMUSCULAR | Status: DC | PRN
  Administered 2012-06-05: 4 mg via INTRAVENOUS

## 2012-06-05 MED ORDER — LACTATED RINGERS IV SOLN
INTRAVENOUS | Status: DC | PRN
  Administered 2012-06-05: 16:00:00 via INTRAVENOUS

## 2012-06-05 MED ORDER — NEOSTIGMINE METHYLSULFATE 1 MG/ML IJ SOLN
INTRAMUSCULAR | Status: DC | PRN
  Administered 2012-06-05: 4 mg via INTRAVENOUS

## 2012-06-05 MED ORDER — ALBUTEROL SULFATE HFA 108 (90 BASE) MCG/ACT IN AERS
4.0000 | INHALATION_SPRAY | Freq: Four times a day (QID) | RESPIRATORY_TRACT | Status: DC
  Administered 2012-06-06 (×2): 4 via RESPIRATORY_TRACT
  Filled 2012-06-05: qty 6.7

## 2012-06-05 MED ORDER — DEXTROSE 5 % IV SOLN
1.5000 g | Freq: Two times a day (BID) | INTRAVENOUS | Status: AC
  Administered 2012-06-06 (×2): 1.5 g via INTRAVENOUS
  Filled 2012-06-05 (×2): qty 1.5

## 2012-06-05 MED ORDER — VECURONIUM BROMIDE 10 MG IV SOLR
INTRAVENOUS | Status: DC | PRN
  Administered 2012-06-05: 3 mg via INTRAVENOUS

## 2012-06-05 MED ORDER — DIPHENHYDRAMINE HCL 50 MG/ML IJ SOLN
12.5000 mg | Freq: Four times a day (QID) | INTRAMUSCULAR | Status: DC | PRN
  Filled 2012-06-05: qty 0.25

## 2012-06-05 MED ORDER — OXYCODONE-ACETAMINOPHEN 5-325 MG PO TABS
1.0000 | ORAL_TABLET | ORAL | Status: DC | PRN
  Administered 2012-06-08 (×3): 1 via ORAL
  Administered 2012-06-09 – 2012-06-10 (×4): 2 via ORAL
  Filled 2012-06-05: qty 2
  Filled 2012-06-05: qty 1
  Filled 2012-06-05 (×2): qty 2
  Filled 2012-06-05: qty 1
  Filled 2012-06-05 (×2): qty 2
  Filled 2012-06-05: qty 1

## 2012-06-05 MED ORDER — SENNOSIDES-DOCUSATE SODIUM 8.6-50 MG PO TABS
1.0000 | ORAL_TABLET | Freq: Every evening | ORAL | Status: DC | PRN
  Filled 2012-06-05: qty 1

## 2012-06-05 MED ORDER — OXYCODONE HCL 5 MG PO TABS: 5.0000 mg | ORAL_TABLET | Freq: Once | ORAL | Status: DC | PRN

## 2012-06-05 MED ORDER — ALBUMIN HUMAN 5 % IV SOLN
INTRAVENOUS | Status: DC | PRN
  Administered 2012-06-05: 16:00:00 via INTRAVENOUS

## 2012-06-05 MED ORDER — TRAMADOL HCL 50 MG PO TABS
50.0000 mg | ORAL_TABLET | Freq: Four times a day (QID) | ORAL | Status: DC | PRN
  Administered 2012-06-10: 100 mg via ORAL
  Filled 2012-06-05 (×2): qty 2

## 2012-06-05 MED ORDER — 0.9 % SODIUM CHLORIDE (POUR BTL) OPTIME
TOPICAL | Status: DC | PRN
  Administered 2012-06-05: 2000 mL

## 2012-06-05 SURGICAL SUPPLY — 63 items
BAG DECANTER FOR FLEXI CONT (MISCELLANEOUS) IMPLANT
BLADE SURG 11 STRL SS (BLADE) ×5 IMPLANT
CANISTER SUCTION 2500CC (MISCELLANEOUS) ×3 IMPLANT
CATH KIT ON Q 5IN SLV (PAIN MANAGEMENT) IMPLANT
CATH ROBINSON RED A/P 22FR (CATHETERS) IMPLANT
CATH THORACIC 28FR (CATHETERS) ×2 IMPLANT
CATH THORACIC 36FR (CATHETERS) IMPLANT
CATH THORACIC 36FR RT ANG (CATHETERS) IMPLANT
CLEANER TIP ELECTROSURG 2X2 (MISCELLANEOUS) ×2 IMPLANT
CLOTH BEACON ORANGE TIMEOUT ST (SAFETY) ×3 IMPLANT
CONT SPEC 4OZ CLIKSEAL STRL BL (MISCELLANEOUS) ×10 IMPLANT
COVER SURGICAL LIGHT HANDLE (MISCELLANEOUS) ×6 IMPLANT
DRAPE LAPAROSCOPIC ABDOMINAL (DRAPES) ×3 IMPLANT
DRAPE WARM FLUID 44X44 (DRAPE) ×3 IMPLANT
ELECT REM PT RETURN 9FT ADLT (ELECTROSURGICAL) ×3
ELECTRODE REM PT RTRN 9FT ADLT (ELECTROSURGICAL) ×1 IMPLANT
GLOVE BIO SURGEON STRL SZ 6 (GLOVE) ×2 IMPLANT
GLOVE BIO SURGEON STRL SZ7.5 (GLOVE) ×6 IMPLANT
GLOVE BIOGEL PI IND STRL 6.5 (GLOVE) IMPLANT
GLOVE BIOGEL PI INDICATOR 6.5 (GLOVE) ×4
GLOVE ORTHO TXT STRL SZ7.5 (GLOVE) ×4 IMPLANT
GLOVE SURG SS PI 7.5 STRL IVOR (GLOVE) ×2 IMPLANT
GOWN STRL NON-REIN LRG LVL3 (GOWN DISPOSABLE) ×9 IMPLANT
HANDLE UNIV ENDO GIA (ENDOMECHANICALS) ×2 IMPLANT
KIT BASIN OR (CUSTOM PROCEDURE TRAY) ×3 IMPLANT
KIT ROOM TURNOVER OR (KITS) ×3 IMPLANT
KIT SUCTION CATH 14FR (SUCTIONS) ×3 IMPLANT
NS IRRIG 1000ML POUR BTL (IV SOLUTION) ×6 IMPLANT
PACK CHEST (CUSTOM PROCEDURE TRAY) ×3 IMPLANT
PAD ARMBOARD 7.5X6 YLW CONV (MISCELLANEOUS) ×6 IMPLANT
RELOAD EGIA 45 MED/THCK PURPLE (STAPLE) ×6 IMPLANT
RELOAD EGIA 60 MED/THCK PURPLE (STAPLE) ×3 IMPLANT
RELOAD STAPLE 60 MED/THCK ART (STAPLE) IMPLANT
SEALANT SURG COSEAL 4ML (VASCULAR PRODUCTS) IMPLANT
SOLUTION ANTI FOG 6CC (MISCELLANEOUS) ×3 IMPLANT
SPONGE GAUZE 4X4 12PLY (GAUZE/BANDAGES/DRESSINGS) ×7 IMPLANT
SPONGE TONSIL 1.25 RF SGL STRG (GAUZE/BANDAGES/DRESSINGS) ×3 IMPLANT
SUT CHROMIC 3 0 SH 27 (SUTURE) IMPLANT
SUT ETHILON 2 0 FS 18 (SUTURE) ×2 IMPLANT
SUT ETHILON 3 0 PS 1 (SUTURE) IMPLANT
SUT PROLENE 3 0 SH DA (SUTURE) IMPLANT
SUT PROLENE 4 0 RB 1 (SUTURE)
SUT PROLENE 4-0 RB1 .5 CRCL 36 (SUTURE) IMPLANT
SUT SILK  1 MH (SUTURE) ×4
SUT SILK 1 MH (SUTURE) ×2 IMPLANT
SUT SILK 2 0SH CR/8 30 (SUTURE) ×2 IMPLANT
SUT SILK 3 0SH CR/8 30 (SUTURE) IMPLANT
SUT VIC AB 1 CTX 18 (SUTURE) IMPLANT
SUT VIC AB 2 TP1 27 (SUTURE) IMPLANT
SUT VIC AB 2-0 CTX 36 (SUTURE) IMPLANT
SUT VIC AB 3-0 X1 27 (SUTURE) ×6 IMPLANT
SUT VICRYL 0 UR6 27IN ABS (SUTURE) ×4 IMPLANT
SUT VICRYL 2 TP 1 (SUTURE) IMPLANT
SWAB COLLECTION DEVICE MRSA (MISCELLANEOUS) IMPLANT
SYSTEM SAHARA CHEST DRAIN ATS (WOUND CARE) ×3 IMPLANT
TAPE CLOTH SURG 4X10 WHT LF (GAUZE/BANDAGES/DRESSINGS) ×2 IMPLANT
TIP APPLICATOR SPRAY EXTEND 16 (VASCULAR PRODUCTS) IMPLANT
TOWEL OR 17X24 6PK STRL BLUE (TOWEL DISPOSABLE) ×3 IMPLANT
TOWEL OR 17X26 10 PK STRL BLUE (TOWEL DISPOSABLE) ×6 IMPLANT
TRAP SPECIMEN MUCOUS 40CC (MISCELLANEOUS) IMPLANT
TRAY FOLEY CATH 14FR (SET/KITS/TRAYS/PACK) ×3 IMPLANT
TUBE ANAEROBIC SPECIMEN COL (MISCELLANEOUS) IMPLANT
WATER STERILE IRR 1000ML POUR (IV SOLUTION) ×4 IMPLANT

## 2012-06-05 NOTE — Progress Notes (Signed)
Pt stated password for family members would be Games.

## 2012-06-05 NOTE — Preoperative (Signed)
Beta Blockers   Reason not to administer Beta Blockers:Not Applicable 

## 2012-06-05 NOTE — Transfer of Care (Signed)
Immediate Anesthesia Transfer of Care Note  Patient: Travis Allen  Procedure(s) Performed: Procedure(s) (LRB) with comments: VIDEO ASSISTED THORACOSCOPY (Right) PLEURADESIS (Right) - Mechanical Pleuradesis RESECTION OF APICAL BLEB (Right)  Patient Location: PACU  Anesthesia Type:General  Level of Consciousness: awake, oriented, patient cooperative and responds to stimulation  Airway & Oxygen Therapy: Patient Spontanous Breathing and Patient connected to nasal cannula oxygen  Post-op Assessment: Report given to PACU RN, Post -op Vital signs reviewed and stable and Patient moving all extremities X 4  Post vital signs: Reviewed and stable  Complications: No apparent anesthesia complications

## 2012-06-05 NOTE — Brief Op Note (Signed)
06/03/2012 - 06/05/2012  5:29 PM  PATIENT:  Travis Allen  31 y.o. male  PRE-OPERATIVE DIAGNOSIS:  Recurrent right pneumothorax, Right apical blebs  POST-OPERATIVE DIAGNOSIS:  Recurrent right pneumothorax, Right Apical blebs  PROCEDURE:   RIGHT VIDEO ASSISTED THORACOSCOPY, RESECTION OF APICAL BLEBS x 2, MECHANICAL PLEURODESIS   SURGEON:  Surgeon(s): Kerin Perna, MD  ASSISTANT: Coral Ceo, PA-C  ANESTHESIA:   general  SPECIMEN:  Right apical blebs  DISPOSITION OF SPECIMEN:  Pathology  DRAINS: 28 Fr CT  PATIENT CONDITION:  PACU - hemodynamically stable.

## 2012-06-05 NOTE — Care Management Note (Unsigned)
    Page 1 of 1   06/05/2012     2:29:54 PM   CARE MANAGEMENT NOTE 06/05/2012  Patient:  Travis Allen, Travis Allen   Account Number:  [id number]  Date Initiated:  06/05/2012  Documentation initiated by:  Alecxander Mainwaring  Subjective/Objective Assessment:   PT ADM WITH RECURRENT SPONTANEOUS PTX, REQUIRING CHEST TUBE PLACEMENT.  PT SCHEDULED FOR VATS TODAY 06/05/12.     Action/Plan:   WILL FOLLOW FOR HOME NEEDS POST OP. PT INDEPENDENT PRIOR TO ADMISSION, LIVES AT HOME.   Anticipated DC Date:  06/09/2012   Anticipated DC Plan:  HOME W HOME HEALTH SERVICES      DC Planning Services  CM consult      Choice offered to / List presented to:             Status of service:  In process, will continue to follow Medicare Important Message given?   (If response is "NO", the following Medicare IM given date fields will be blank) Date Medicare IM given:   Date Additional Medicare IM given:    Discharge Disposition:    Per UR Regulation:  Reviewed for med. necessity/level of care/duration of stay  If discussed at Long Length of Stay Meetings, dates discussed:    Comments:

## 2012-06-05 NOTE — Anesthesia Postprocedure Evaluation (Signed)
  Anesthesia Post-op Note  Patient: Travis Allen  Procedure(s) Performed: Procedure(s) (LRB) with comments: VIDEO ASSISTED THORACOSCOPY (Right) PLEURADESIS (Right) - Mechanical Pleuradesis RESECTION OF APICAL BLEB (Right)  Patient Location: PACU  Anesthesia Type:General  Level of Consciousness: awake  Airway and Oxygen Therapy: Patient Spontanous Breathing  Post-op Pain: mild  Post-op Assessment: Post-op Vital signs reviewed  Post-op Vital Signs: stable  Complications: No apparent anesthesia complications

## 2012-06-05 NOTE — Progress Notes (Signed)
The patient was examined and preop studies reviewed. There has been no change from the prior exam and the patient is ready for surgery.  Plan R VATS on [place]

## 2012-06-05 NOTE — Progress Notes (Signed)
OR transport in room; pt to be transported to OR for R VATS.

## 2012-06-05 NOTE — Anesthesia Preprocedure Evaluation (Addendum)
Anesthesia Evaluation  Patient identified by MRN, date of birth, ID band Patient awake    Reviewed: Allergy & Precautions, H&P , NPO status , Patient's Chart, lab work & pertinent test results  Airway Mallampati: II TM Distance: >3 FB     Dental  (+) Dental Advisory Given,    Pulmonary Current Smoker,  breath sounds clear to auscultation        Cardiovascular negative cardio ROS  Rhythm:Regular Rate:Normal     Neuro/Psych    GI/Hepatic negative GI ROS, Neg liver ROS,   Endo/Other  negative endocrine ROS  Renal/GU negative Renal ROS     Musculoskeletal   Abdominal Normal abdominal exam  (+)   Peds  Hematology negative hematology ROS (+)   Anesthesia Other Findings   Reproductive/Obstetrics                           Anesthesia Physical Anesthesia Plan  ASA: II  Anesthesia Plan: General   Post-op Pain Management:    Induction: Intravenous  Airway Management Planned: Double Lumen EBT  Additional Equipment: Arterial line  Intra-op Plan:   Post-operative Plan: Extubation in OR  Informed Consent: I have reviewed the patients History and Physical, chart, labs and discussed the procedure including the risks, benefits and alternatives for the proposed anesthesia with the patient or authorized representative who has indicated his/her understanding and acceptance.   Dental advisory given  Plan Discussed with: Anesthesiologist and Surgeon  Anesthesia Plan Comments:         Anesthesia Quick Evaluation

## 2012-06-06 ENCOUNTER — Inpatient Hospital Stay (HOSPITAL_COMMUNITY): Payer: MEDICAID

## 2012-06-06 LAB — BLOOD GAS, ARTERIAL
Acid-Base Excess: 2.9 mmol/L — ABNORMAL HIGH (ref 0.0–2.0)
Bicarbonate: 26.7 mEq/L — ABNORMAL HIGH (ref 20.0–24.0)
Drawn by: 347641
FIO2: 0.21 %
O2 Saturation: 98.2 %
Patient temperature: 98.6
TCO2: 27.9 mmol/L (ref 0–100)
pCO2 arterial: 39.4 mmHg (ref 35.0–45.0)
pH, Arterial: 7.446 (ref 7.350–7.450)
pO2, Arterial: 82.2 mmHg (ref 80.0–100.0)

## 2012-06-06 LAB — BASIC METABOLIC PANEL
BUN: 6 mg/dL (ref 6–23)
CO2: 27 mEq/L (ref 19–32)
Calcium: 8.8 mg/dL (ref 8.4–10.5)
Chloride: 104 mEq/L (ref 96–112)
Creatinine, Ser: 0.76 mg/dL (ref 0.50–1.35)
GFR calc Af Amer: >90 mL/min (ref >90)
GFR calc non Af Amer: >90 mL/min (ref >90)
Glucose, Bld: 115 mg/dL — ABNORMAL HIGH (ref 70–99)
Potassium: 3.7 mEq/L (ref 3.5–5.1)
Sodium: 139 mEq/L (ref 135–145)

## 2012-06-06 LAB — CBC
HCT: 37.8 % — ABNORMAL LOW (ref 39.0–52.0)
Hemoglobin: 13.1 g/dL (ref 13.0–17.0)
MCH: 29.2 pg (ref 26.0–34.0)
MCHC: 34.7 g/dL (ref 30.0–36.0)
MCV: 84.4 fL (ref 78.0–100.0)
Platelets: 209 10*3/uL (ref 150–400)
RBC: 4.48 MIL/uL (ref 4.22–5.81)
RDW: 12.1 % (ref 11.5–15.5)
WBC: 11.8 10*3/uL — ABNORMAL HIGH (ref 4.0–10.5)

## 2012-06-06 MED ORDER — ALBUTEROL SULFATE HFA 108 (90 BASE) MCG/ACT IN AERS
2.0000 | INHALATION_SPRAY | RESPIRATORY_TRACT | Status: DC | PRN
  Filled 2012-06-06: qty 6.7

## 2012-06-06 NOTE — Progress Notes (Signed)
1 Day Post-Op Procedure(s) (LRB): VIDEO ASSISTED THORACOSCOPY (Right) PLEURADESIS (Right) RESECTION OF APICAL BLEB (Right) Subjective:  Travis Allen complains of some soreness this morning.  He states his PCA provides relief.  He is ambulating with assist.  Objective: Vital signs in last 24 hours: Temp:  [96.9 F (36.1 C)-98.9 F (37.2 C)] 98.8 F (37.1 C) (11/16 0757) Pulse Rate:  [77-111] 109  (11/16 0300) Cardiac Rhythm:  [-] Normal sinus rhythm (11/16 0400) Resp:  [14-20] 18  (11/16 0800) BP: (103-144)/(54-76) 103/61 mmHg (11/16 0300) SpO2:  [94 %-100 %] 97 % (11/16 0905) Arterial Line BP: (120-147)/(56-71) 120/64 mmHg (11/16 0000) Weight:  [141 lb 5 oz (64.1 kg)] 141 lb 5 oz (64.1 kg) (11/15 2000)    Intake/Output from previous day: 11/15 0701 - 11/16 0700 In: 2266.3 [I.V.:2016.3; IV Piggyback:250] Out: 3639 [Urine:3575; Chest Tube:64] Intake/Output this shift: Total I/O In: -  Out: 1300 [Urine:1300]  General appearance: alert, cooperative and no distress Heart: regular rate and rhythm Lungs: clear to auscultation bilaterally Abdomen: soft, non-tender; bowel sounds normal; no masses,  no organomegaly Wound: clean and dry  Lab Results:  [facility] 06/06/12 0415 06/04/12 1509  WBC 11.8* 5.1  HGB 13.1 12.5*  HCT 37.8* 36.3*  PLT 209 196   BMET:  Basename 06/06/12 0415 06/04/12 1509  NA 139 137  K 3.7 3.6  CL 104 104  CO2 27 28  GLUCOSE 115* 104*  BUN 6 7  CREATININE 0.76 0.92  CALCIUM 8.8 8.4    PT/INR:  Basename 06/04/12 1509  LABPROT 14.9  INR 1.19   ABG    Component Value Date/Time   PHART 7.446 06/06/2012 0419   HCO3 26.7* 06/06/2012 0419   TCO2 27.9 06/06/2012 0419   O2SAT 98.2 06/06/2012 0419   CBG (last 3)  No results found for this basename: GLUCAP:3 in the last 72 hours  Assessment/Plan: S/P Procedure(s) (LRB): VIDEO ASSISTED THORACOSCOPY (Right) PLEURADESIS (Right) RESECTION OF APICAL BLEB (Right)  1. Chest tube in place- small air  leak with cough will leave on suction today, CXR shows improvement of pneumothorax 2. D/C Foley Catheter 3. D/C Arterial Line 4. Dispo- patient doing well, leave chest tube to suction today, repeat CXR in AM   LOS: 3 days    Raford Pitcher, Vickye Astorino 06/06/2012

## 2012-06-06 NOTE — Progress Notes (Signed)
Pt arrived to 3308 from PACU.  Alert and oriented, vital signs stable.  CT connected to 20cm suction.  Oriented to unit and instructed regarding PCA.  Pain decreasing with use of PCA.  No complaints.  Will continue to monitor.    Maximino Greenland RN

## 2012-06-07 ENCOUNTER — Inpatient Hospital Stay (HOSPITAL_COMMUNITY): Payer: MEDICAID

## 2012-06-07 LAB — CBC
HCT: 40.8 % (ref 39.0–52.0)
Hemoglobin: 14 g/dL (ref 13.0–17.0)
MCH: 29.6 pg (ref 26.0–34.0)
MCHC: 34.3 g/dL (ref 30.0–36.0)
MCV: 86.3 fL (ref 78.0–100.0)
Platelets: 246 10*3/uL (ref 150–400)
RBC: 4.73 MIL/uL (ref 4.22–5.81)
RDW: 12.5 % (ref 11.5–15.5)
WBC: 10.8 10*3/uL — ABNORMAL HIGH (ref 4.0–10.5)

## 2012-06-07 LAB — COMPREHENSIVE METABOLIC PANEL
ALT: 15 U/L (ref 0–53)
AST: 18 U/L (ref 0–37)
Albumin: 3.3 g/dL — ABNORMAL LOW (ref 3.5–5.2)
Alkaline Phosphatase: 65 U/L (ref 39–117)
BUN: 6 mg/dL (ref 6–23)
CO2: 27 mEq/L (ref 19–32)
Calcium: 9.5 mg/dL (ref 8.4–10.5)
Chloride: 101 mEq/L (ref 96–112)
Creatinine, Ser: 0.78 mg/dL (ref 0.50–1.35)
GFR calc Af Amer: >90 mL/min (ref >90)
GFR calc non Af Amer: >90 mL/min (ref >90)
Glucose, Bld: 102 mg/dL — ABNORMAL HIGH (ref 70–99)
Potassium: 4 mEq/L (ref 3.5–5.1)
Sodium: 137 mEq/L (ref 135–145)
Total Bilirubin: 0.3 mg/dL (ref 0.3–1.2)
Total Protein: 6.8 g/dL (ref 6.0–8.3)

## 2012-06-07 NOTE — Op Note (Signed)
NAMETRIG, MCBRYAR NO.:  [id number]  MEDICAL RECORD NO.:  [id number]  LOCATION:  3308                         FACILITY:  [facility]  PHYSICIAN:  Kerin Perna, M.D.  DATE OF BIRTH:  1981/01/20  DATE OF PROCEDURE:  06/06/2012 DATE OF DISCHARGE:                              OPERATIVE REPORT   OPERATION: 1. Right video assisted thoracoscopic surgery, resection of apical     blebs. 2. Mechanical pleurodesis of right parietal pleura.  PREOPERATIVE DIAGNOSIS:  Recurrent spontaneous right pneumothorax with apical blebs noted on CT scan.  POSTOPERATIVE DIAGNOSIS:  Recurrent spontaneous right pneumothorax with apical blebs noted on CT scan.  SURGEON:  Kerin Perna, MD  ASSISTANT:  Coral Ceo, PA-C  ANESTHESIA:  General.  INDICATIONS:  The patient is a 31 year old Caucasian male smoker who was transferred from an outside hospital with a right chest tube, having presented with a recurrent large right spontaneous pneumothorax. Because of the recurrent nature of the pneumothorax, it is felt that the VATS with apical bleb resection and pleurodesis would be the best long- term option.  I explained the procedure to the patient including the indications, benefits, risks, and alternatives.  He understood and agreed to proceed with surgery.  OPERATIVE FINDINGS: 1. Apical blebs. 2. 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 No postoperative air leak.  PROCEDURE:  The patient was brought to the operating room and placed supine on the operating table.  General anesthesia was induced.  The patient was intubated with a double-lumen endotracheal tube and turned to expose right side up.  The right chest was prepped and draped as a sterile field.  A proper time-out was performed.  VATS portal incisions were made around the circumference of right chest first at the tip of the scapula where the camera was inserted.  Thorax was examined.  The lungs had changes of emphysema and  smoker's lungs.  There were blebs noted in the apex of the right lung, especially posteriorly.  The upper lobe had a single adhesion which was divided.  Two small portal incisions were made anteriorly and posteriorly and using the VATS camera and the Endo-GIA stapling devices, the blebs were resected from the apex using several staple loads.  The staple lines were inspected and found to be air tight and hemostatic.  Next a parietal pleurectomy and pleurodesis was performed from the top of the right hemithorax circumferentially down to the mid chest wall level.  Excessive bleeding was controlled with electrocautery.  Next, a 28-French chest tube was placed to a new incision and directed to the apex and secured to the skin.  The lung was then re-expanded under direct vision.  These small portal incisions were then closed with Vicryl for the fascia and a subcuticular Vicryl.  The old chest tube site was closed with interrupted 3-0 nylon.  Sterile dressings were applied and the chest tube was connected to underwater seal Pleur-evac system.  The patient was extubated and returned to the recovery room in stable condition.     Kerin Perna, M.D.     PV/MEDQ  D:  06/06/2012  T:  06/07/2012  Job:  [id number]

## 2012-06-07 NOTE — Progress Notes (Addendum)
2 Days Post-Op Procedure(s) (LRB): VIDEO ASSISTED THORACOSCOPY (Right) PLEURADESIS (Right) RESECTION OF APICAL BLEB (Right) Subjective:  [name] continues to complain of some back pain.  He states the PCA is helping.  His PO intake is improving.   Objective: Vital signs in last 24 hours: Temp:  [97.4 F (36.3 C)-99.6 F (37.6 C)] 99.6 F (37.6 C) (11/17 0758) Pulse Rate:  [72-97] 97  (11/17 0758) Cardiac Rhythm:  [-] Normal sinus rhythm (11/17 0822) Resp:  [16-22] 22  (11/17 0800) BP: (108-120)/(57-70) 114/69 mmHg (11/17 0758) SpO2:  [94 %-97 %] 94 % (11/17 0822)  Intake/Output from previous day: 11/16 0701 - 11/17 0700 In: 2730 [P.O.:1080; I.V.:1650] Out: 3670 [Urine:3600; Chest Tube:70] Intake/Output this shift: Total I/O In: 75 [I.V.:75] Out: 575 [Urine:575]  General appearance: alert, cooperative and no distress Heart: regular rate and rhythm Lungs: clear to auscultation bilaterally Abdomen: soft, non-tender; bowel sounds normal; no masses,  no organomegaly Wound: clean and dry  Lab Results:  Basename 06/07/12 0630 06/06/12 0415  WBC 10.8* 11.8*  HGB 14.0 13.1  HCT 40.8 37.8*  PLT 246 209   BMET:  Basename 06/07/12 0630 06/06/12 0415  NA 137 139  K 4.0 3.7  CL 101 104  CO2 27 27  GLUCOSE 102* 115*  BUN 6 6  CREATININE 0.78 0.76  CALCIUM 9.5 8.8    PT/INR:  Basename 06/04/12 1509  LABPROT 14.9  INR 1.19   ABG    Component Value Date/Time   PHART 7.446 06/06/2012 0419   HCO3 26.7* 06/06/2012 0419   TCO2 27.9 06/06/2012 0419   O2SAT 98.2 06/06/2012 0419   CBG (last 3)  No results found for this basename: GLUCAP:3 in the last 72 hours  Assessment/Plan: S/P Procedure(s) (LRB): VIDEO ASSISTED THORACOSCOPY (Right) PLEURADESIS (Right) RESECTION OF APICAL BLEB (Right)  1. Chest tube in place- no air leak, 70cc output yesterday, no pneumothorax on CXR will place tube to water seal 2. Pain control- cont PCA 3. Dispo- patient doing well,  encouraged PO intake, hopefully d/c Chest tube in AM   LOS: 4 days    BARRETT, ERIN 06/07/2012   patient examined and medical record reviewed,agree with above note. VAN TRIGT III,[place] 06/07/2012

## 2012-06-08 ENCOUNTER — Inpatient Hospital Stay (HOSPITAL_COMMUNITY): Payer: MEDICAID

## 2012-06-08 ENCOUNTER — Encounter (HOSPITAL_COMMUNITY): Payer: Self-pay | Admitting: Cardiothoracic Surgery

## 2012-06-08 NOTE — Progress Notes (Signed)
Pt tried percocet and says effective; d/c PCA? (PO trans mentioned in notes)

## 2012-06-08 NOTE — Progress Notes (Signed)
[address]            [place] [address]          [phone]     3 Days Post-Op  Procedure(s) (LRB): VIDEO ASSISTED THORACOSCOPY (Right) PLEURADESIS (Right) RESECTION OF APICAL BLEB (Right) Subjective  no new c/o, some pain, not SOB  Objective  Temp:  [97.4 F (36.3 C)-98.9 F (37.2 C)] 97.4 F (36.3 C) (11/18 1220) Pulse Rate:  [92-103] 96  (11/18 1220) Resp:  [15-19] 15  (11/18 0724) BP: (101-116)/(59-69) 106/68 mmHg (11/18 1220) SpO2:  [89 %-98 %] 98 % (11/18 1220) FiO2 (%):  [28 %] 28 % (11/17 2000)   Intake/Output Summary (Last 24 hours) at 06/08/12 1524 Last data filed at 06/08/12 0600  Gross per 24 hour  Intake    780 ml  Output   1450 ml  Net   -670 ml       General appearance: alert, cooperative and no distress Heart: regular rate and rhythm Lungs: clear to auscultation bilaterally Wound: dressings with some serosang drainage  Lab Results:  [facility] 06/07/12 0630 06/06/12 0415  NA 137 139  K 4.0 3.7  CL 101 104  CO2 27 27  GLUCOSE 102* 115*  BUN 6 6  CREATININE 0.78 0.76  CALCIUM 9.5 8.8  MG -- --  PHOS -- --    Basename 06/07/12 0630  AST 18  ALT 15  ALKPHOS 65  BILITOT 0.3  PROT 6.8  ALBUMIN 3.3*   No results found for this basename: LIPASE:2,AMYLASE:2 in the last 72 hours  Basename 06/07/12 0630 06/06/12 0415  WBC 10.8* 11.8*  NEUTROABS -- --  HGB 14.0 13.1  HCT 40.8 37.8*  MCV 86.3 84.4  PLT 246 209   No results found for this basename: CKTOTAL:4,CKMB:4,TROPONINI:4 in the last 72 hours No components found with this basename: POCBNP:3 No results found for this basename: DDIMER in the last 72 hours No results found for this basename: HGBA1C in the last 72 hours No results found for this basename: CHOL,HDL,LDLCALC,TRIG,CHOLHDL in the last 72 hours No results found for this basename: TSH,T4TOTAL,FREET3,T3FREE,THYROIDAB in the last 72 hours No results found for this basename:  VITAMINB12,FOLATE,FERRITIN,TIBC,IRON,RETICCTPCT in the last 72 hours  Medications: Scheduled    . bisacodyl  10 mg Oral Daily  . budesonide-formoterol  2 puff Inhalation BID  . fentaNYL   Intravenous Q4H     Radiology/Studies:  Dg Chest 2 View  06/08/2012  *RADIOLOGY REPORT*  Clinical Data: VATS, chest tube.  CHEST - 2 VIEW  Comparison: 06/07/2012.  CT chest 06/04/2012.  Findings: Trachea is midline.  Heart size normal.  Right chest tube terminates at the apex of the right hemithorax.  Tiny right apical pneumothorax.  Postoperative changes are seen in the medial apex of the right hemithorax.  Lungs are hyperinflated but otherwise clear.  IMPRESSION:  1.  Tiny right apical pneumothorax with right chest tube in place. 2.  Postoperative changes and mild volume loss at the apex of the right hemithorax.   Original Report Authenticated By: Leanna Battles, M.D.    Dg Chest Port 1 View  06/07/2012  *RADIOLOGY REPORT*  Clinical Data: Pneumothorax, right chest tube  PORTABLE CHEST - 1 VIEW  Comparison: 06/06/2012  Findings: Postsurgical changes in the medial right lung apex.  Stable right apical chest tube.  No definite pneumothorax is seen.  The heart  is normal in size.  IMPRESSION: Stable right apical chest tube.  No definite pneumothorax is seen.  Postsurgical changes in the medial right lung apex.   Original Report Authenticated By: Charline Bills, M.D.     INR: Will add last result for INR, ABG once components are confirmed Will add last 4 CBG results once components are confirmed  Assessment/Plan: S/P Procedure(s) (LRB): VIDEO ASSISTED THORACOSCOPY (Right) PLEURADESIS (Right) RESECTION OF APICAL BLEB (Right)  1. Very small PNTX on H20 seal, no air leak. Repeat CXR in am and prob d/c then 2 good pain relief with PCA, will need to transition to po meds soon     LOS: 5 days    GOLD,WAYNE E 11/18/20133:24 PM

## 2012-06-09 ENCOUNTER — Inpatient Hospital Stay (HOSPITAL_COMMUNITY): Payer: MEDICAID

## 2012-06-09 DIAGNOSIS — Z9889 Other specified postprocedural states: Secondary | ICD-10-CM

## 2012-06-09 DIAGNOSIS — J939 Pneumothorax, unspecified: Secondary | ICD-10-CM

## 2012-06-09 MED ORDER — ALBUTEROL SULFATE HFA 108 (90 BASE) MCG/ACT IN AERS: 2.0000 | INHALATION_SPRAY | RESPIRATORY_TRACT | Status: AC | PRN

## 2012-06-09 MED ORDER — TRAMADOL HCL 50 MG PO TABS: 50.0000 mg | ORAL_TABLET | Freq: Four times a day (QID) | ORAL | Status: DC | PRN

## 2012-06-09 MED ORDER — BUDESONIDE-FORMOTEROL FUMARATE 160-4.5 MCG/ACT IN AERO: 2.0000 | INHALATION_SPRAY | Freq: Two times a day (BID) | RESPIRATORY_TRACT | Status: DC

## 2012-06-09 MED ORDER — OXYCODONE-ACETAMINOPHEN 5-325 MG PO TABS: 1.0000 | ORAL_TABLET | ORAL | Status: DC | PRN

## 2012-06-09 NOTE — Progress Notes (Signed)
PCA fentanyl d/c'd per MD order. 11 mL WIS. Beth Qunicy Higinbotham, RN witness Julien Girt, RN

## 2012-06-09 NOTE — Progress Notes (Addendum)
4 Days Post-Op Procedure(s) (LRB): VIDEO ASSISTED THORACOSCOPY (Right) PLEURADESIS (Right) RESECTION OF APICAL BLEB (Right) Subjective:  Mr. Granucci continues to complain of pain this morning.  He is trying not use his PCA.   Objective: Vital signs in last 24 hours: Temp:  [97.3 F (36.3 C)-98.4 F (36.9 C)] 98.3 F (36.8 C) (11/19 0800) Pulse Rate:  [89-96] 96  (11/19 0415) Cardiac Rhythm:  [-] Normal sinus rhythm (11/19 0415) Resp:  [15-17] 16  (11/19 0415) BP: (104-109)/(65-73) 109/67 mmHg (11/19 0415) SpO2:  [96 %-98 %] 97 % (11/19 0415) FiO2 (%):  [24 %] 24 % (11/19 0000) Intake/Output from previous day: 11/18 0701 - 11/19 0700 In: 440 [P.O.:240; I.V.:200] Out: 1400 [Urine:1400]  General appearance: alert, cooperative and no distress Heart: regular rate and rhythm Lungs: clear to auscultation bilaterally Abdomen: soft, non-tender; bowel sounds normal; no masses,  no organomegaly Wound: clean and dry  Lab Results:  Basename 06/07/12 0630  WBC 10.8*  HGB 14.0  HCT 40.8  PLT 246   BMET:  Basename 06/07/12 0630  NA 137  K 4.0  CL 101  CO2 27  GLUCOSE 102*  BUN 6  CREATININE 0.78  CALCIUM 9.5    PT/INR: No results found for this basename: LABPROT,INR in the last 72 hours ABG    Component Value Date/Time   PHART 7.446 06/06/2012 0419   HCO3 26.7* 06/06/2012 0419   TCO2 27.9 06/06/2012 0419   O2SAT 98.2 06/06/2012 0419   CBG (last 3)  No results found for this basename: GLUCAP:3 in the last 72 hours  Assessment/Plan: S/P Procedure(s) (LRB): VIDEO ASSISTED THORACOSCOPY (Right) PLEURADESIS (Right) RESECTION OF APICAL BLEB (Right)  1. Chest tube removed- f/u CXR no evidence of pneumothorax 2. Pain control- will d/c PCA, need to get pain controlled with oral medications  3. Dispo- hopefully able to maintain pain control today and d/c home in AM   LOS: 6 days    BARRETT, ERIN 06/09/2012   patient examined and medical record reviewed,agree with  above note. VAN TRIGT III,PETER 06/09/2012

## 2012-06-09 NOTE — Discharge Summary (Signed)
patient examined and medical record reviewed,agree with above note. VAN TRIGT III,PETER 06/09/2012

## 2012-06-09 NOTE — Discharge Summary (Signed)
Physician Discharge Summary  Patient ID: Travis Allen MRN: [id number] DOB/AGE: 1981-01-22 31 y.o.  Admit date: 06/03/2012 Discharge date: 06/09/2012  Admission Diagnoses:  Patient Active Problem List  Diagnosis  . Pneumothorax    Discharge Diagnoses:   Patient Active Problem List  Diagnosis  . Pneumothorax  . S/P thoracotomy   Discharged Condition: good  Hospital Course:  Travis Allen is a 31 yo white male with history of nicotine abuse and spontaneous pneumothorax.  The most recent occurrence the patient presented to the Emergency Department at [facility] with a complaint of progressively worsening chest pain.  The pain was located along the patient's right chest and developed approximately 3 days prior.  He experienced associated shortness of breath.  He described the symptoms as similar to his previous pneumothorax occurrences.  Chest xray obtained in the Emergency Department confirmed the presence of a 15-20% hydropneumothorax with no evidence of tension.  He underwent chest tube placement in the Emergency Department.  He was transferred to [facility] under the care of Dr. Tyrone Sage for VATS procedure.  Upon arrival the patient was stable.  Chest xray obtained showed the patient's lung to be fully re-expanded.  The risks and benefits of a VATS procedure were explained to the patient and he was agreeable to proceed.  HD #1 06/05/2012 the patient was taken to the operating room and underwent Right Sided VATS with Resection of Apical Blebs x 2 and Mechanical Pleurodesis.  The patient tolerated the procedure well and was extubated prior to leaving the operating room.  The patient has done well post operatively .  His chest xray was removed without difficulty.  Follow up chest xray does not show evidence of pneumothorax.  The patient continues to have issues with pain control.  We will wean him off his PCA and control his pain with oral pain medications.  He is medically  stable and will be ready for discharge in the morning.  He will follow up with Dr. Donata Clay in 2 weeks with a chest xray prior to his appointment.    Treatments: surgery:   Right video assisted thoracoscopic surgery, resection of apical  blebs.  2. Mechanical pleurodesis of right parietal pleura.   Disposition: 01-Home or Self Care     Medication List     As of 06/09/2012  8:52 AM    TAKE these medications         albuterol 108 (90 BASE) MCG/ACT inhaler   Commonly known as: PROVENTIL HFA;VENTOLIN HFA   Inhale 2 puffs into the lungs every 4 (four) hours as needed for wheezing or shortness of breath.      budesonide-formoterol 160-4.5 MCG/ACT inhaler   Commonly known as: SYMBICORT   Inhale 2 puffs into the lungs 2 (two) times daily.      oxyCODONE-acetaminophen 5-325 MG per tablet   Commonly known as: PERCOCET/ROXICET   Take 1-2 tablets by mouth every 4 (four) hours as needed.      traMADol 50 MG tablet   Commonly known as: ULTRAM   Take 1-2 tablets (50-100 mg total) by mouth every 6 (six) hours as needed.           Follow-up Information    Follow up with VAN Dinah Beers, MD. In 2 weeks.   Contact information:   [address] [employer] [address] [place] [address] [phone]       Follow up with [facility]. In 2 weeks. (Please get chest xray 1  hour prior to your appointment with Dr. Donata Clay)    Contact information:   [address] [place] [address]          Signed: Lowella Dandy 06/09/2012, 8:52 AM

## 2012-06-10 NOTE — Progress Notes (Signed)
5 Days Post-Op Procedure(s) (LRB): VIDEO ASSISTED THORACOSCOPY (Right) PLEURADESIS (Right) RESECTION OF APICAL BLEB (Right) Subjective:  [name] has no complaints this morning.  He pain is controlled with Percocet and Ultram.    Objective: Vital signs in last 24 hours: Temp:  [97.5 F (36.4 C)-98.7 F (37.1 C)] 97.7 F (36.5 C) (11/20 0800) Pulse Rate:  [79-91] 82  (11/20 0800) Cardiac Rhythm:  [-] Normal sinus rhythm (11/20 0800) Resp:  [11-19] 14  (11/20 0800) BP: (99-113)/(64-70) 104/64 mmHg (11/20 0800) SpO2:  [91 %-96 %] 94 % (11/20 0800)    Intake/Output from previous day: 11/19 0701 - 11/20 0700 In: 1816 [P.O.:1810; I.V.:6] Out: 1325 [Urine:1325] Intake/Output this shift: Total I/O In: 350 [P.O.:350] Out: -   General appearance: alert, cooperative and no distress Neurologic: intact Heart: regular rate and rhythm Lungs: clear to auscultation bilaterally Abdomen: soft, non-tender; bowel sounds normal; no masses,  no organomegaly Wound: clean and dry, sutures remain in place  Lab Results: No results found for this basename: WBC:2,HGB:2,HCT:2,PLT:2 in the last 72 hours BMET: No results found for this basename: NA:2,K:2,CL:2,CO2:2,GLUCOSE:2,BUN:2,CREATININE:2,CALCIUM:2 in the last 72 hours  PT/INR: No results found for this basename: LABPROT,INR in the last 72 hours ABG    Component Value Date/Time   PHART 7.446 06/06/2012 0419   HCO3 26.7* 06/06/2012 0419   TCO2 27.9 06/06/2012 0419   O2SAT 98.2 06/06/2012 0419   CBG (last 3)  No results found for this basename: GLUCAP:3 in the last 72 hours  Assessment/Plan: S/P Procedure(s) (LRB): VIDEO ASSISTED THORACOSCOPY (Right) PLEURADESIS (Right) RESECTION OF APICAL BLEB (Right)  1. Pain Control- patient doing well will Percocet and Ultram 2. Sutures remain in place and will be removed at 2 week follow up 3. Dispo- patient doing well will d/c home today   LOS: 7 days    Raford Pitcher, ERIN 06/10/2012

## 2012-06-10 NOTE — Progress Notes (Signed)
Patient d/c'd home per MD order.  Patient given d/c instructions/prescriptions and all questions answered.  Patient d/c'd via wheelchair with NS/MT and family to meet at front door.

## 2012-06-23 ENCOUNTER — Other Ambulatory Visit: Payer: Self-pay | Admitting: Cardiothoracic Surgery

## 2012-06-23 DIAGNOSIS — J939 Pneumothorax, unspecified: Secondary | ICD-10-CM

## 2012-06-24 ENCOUNTER — Ambulatory Visit
Admission: RE | Admit: 2012-06-24 | Discharge: 2012-06-24 | Disposition: A | Discharge status: Home / Self Care | Payer: No Typology Code available for payment source | Source: Ambulatory Visit | Attending: Cardiothoracic Surgery | Admitting: Cardiothoracic Surgery

## 2012-06-24 ENCOUNTER — Ambulatory Visit (INDEPENDENT_AMBULATORY_CARE_PROVIDER_SITE_OTHER): Payer: Self-pay | Admitting: Cardiothoracic Surgery

## 2012-06-24 ENCOUNTER — Encounter: Payer: Self-pay | Admitting: Cardiothoracic Surgery

## 2012-06-24 VITALS — BP 102/55 | HR 74 | Resp 18 | Ht 76.0 in | Wt 140.0 lb

## 2012-06-24 DIAGNOSIS — Z09 Encounter for follow-up examination after completed treatment for conditions other than malignant neoplasm: Secondary | ICD-10-CM

## 2012-06-24 DIAGNOSIS — J939 Pneumothorax, unspecified: Secondary | ICD-10-CM

## 2012-06-24 DIAGNOSIS — J9383 Other pneumothorax: Secondary | ICD-10-CM

## 2012-06-24 NOTE — Progress Notes (Signed)
PCP is No primary provider on file. Referring Provider is Lyanne Co, MD  Chief Complaint  Patient presents with  . Routine Post Op    2 week f/u from surgery with CXR, S/P Rt VATS, resectionof apical blebs on  06/06/12    HPI: Patient returns for followup after right VATS, stapling of blebs, pleurodesed is for spontaneous pneumothorax, recurrent Doing well following surgery. No significant pain. Smokes reduced to one half pack daily   Past Medical History  Diagnosis Date  . Pneumothorax on left     Past Surgical History  Procedure Date  . Chest tube insertion   . Video assisted thoracoscopy 06/05/2012    Procedure: VIDEO ASSISTED THORACOSCOPY;  Surgeon: Kerin Perna, MD;  Location: [facility] OR;  Service: Thoracic;  Laterality: Right;  . Pleuradesis 06/05/2012    Procedure: Cheron Every;  Surgeon: Kerin Perna, MD;  Location: [facility] OR;  Service: Thoracic;  Laterality: Right;  Mechanical Pleuradesis  . Resection of apical bleb 06/05/2012    Procedure: RESECTION OF APICAL BLEB;  Surgeon: Kerin Perna, MD;  Location: [facility] OR;  Service: Thoracic;  Laterality: Right;    No family history on file.  Social History History  Substance Use Topics  . Smoking status: Current Every Day Smoker -- 1.0 packs/day for 21 years    Types: Cigarettes  . Smokeless tobacco: Not on file  . Alcohol Use: Yes     Comment: occasionally    Current Outpatient Prescriptions  Medication Sig Dispense Refill  . albuterol (PROVENTIL HFA;VENTOLIN HFA) 108 (90 BASE) MCG/ACT inhaler Inhale 2 puffs into the lungs every 4 (four) hours as needed for wheezing or shortness of breath.  1 Inhaler  1  . budesonide-formoterol (SYMBICORT) 160-4.5 MCG/ACT inhaler Inhale 2 puffs into the lungs 2 (two) times daily.  1 Inhaler  1  . oxyCODONE-acetaminophen (PERCOCET/ROXICET) 5-325 MG per tablet Take 1-2 tablets by mouth every 4 (four) hours as needed.  40 tablet  0  . traMADol (ULTRAM) 50 MG tablet Take 1-2 tablets  (50-100 mg total) by mouth every 6 (six) hours as needed.  40 tablet  0    No Known Allergies  Review of Systems no fever or drainage from incisions  BP 102/55  Pulse 74  Resp 18  Ht 6\' 4"  (1.93 m)  Wt 140 lb (63.504 kg)  BMI 17.04 kg/m2  SpO2 99% Physical Exam Sutures removed from incisions all incisions clean and dry Breath sounds clear and equal Heart rhythm regular  Diagnostic Tests: Chest x-ray shows no significant pneumothorax or pleural effusion or infiltrate following right VATS  Impression:  Doing well following surgery. Patient may drive. Patient may lift up to 20 pounds for the next month then activity Unlimited. Smoking cessation strongly recommended to the patient Plan: Return as needed, total smoking cessation

## 2013-11-13 ENCOUNTER — Observation Stay (HOSPITAL_COMMUNITY)
Admission: EM | Admit: 2013-11-13 | Discharge: 2013-11-15 | Disposition: A | Discharge status: Home / Self Care | Payer: Self-pay | Attending: Internal Medicine | Admitting: Internal Medicine

## 2013-11-13 ENCOUNTER — Emergency Department (HOSPITAL_COMMUNITY): Payer: Self-pay

## 2013-11-13 ENCOUNTER — Encounter (HOSPITAL_COMMUNITY): Payer: Self-pay | Admitting: Emergency Medicine

## 2013-11-13 DIAGNOSIS — F17200 Nicotine dependence, unspecified, uncomplicated: Secondary | ICD-10-CM | POA: Insufficient documentation

## 2013-11-13 DIAGNOSIS — Z9889 Other specified postprocedural states: Secondary | ICD-10-CM

## 2013-11-13 DIAGNOSIS — R64 Cachexia: Secondary | ICD-10-CM | POA: Insufficient documentation

## 2013-11-13 DIAGNOSIS — R1084 Generalized abdominal pain: Secondary | ICD-10-CM

## 2013-11-13 DIAGNOSIS — R109 Unspecified abdominal pain: Secondary | ICD-10-CM | POA: Diagnosis present

## 2013-11-13 DIAGNOSIS — J939 Pneumothorax, unspecified: Secondary | ICD-10-CM

## 2013-11-13 DIAGNOSIS — K5641 Fecal impaction: Principal | ICD-10-CM | POA: Insufficient documentation

## 2013-11-13 DIAGNOSIS — Z681 Body mass index (BMI) 19 or less, adult: Secondary | ICD-10-CM | POA: Insufficient documentation

## 2013-11-13 LAB — CBC WITH DIFFERENTIAL/PLATELET
BASOS PCT: 0 % (ref 0–1)
Basophils Absolute: 0 10*3/uL (ref 0.0–0.1)
EOS PCT: 2 % (ref 0–5)
Eosinophils Absolute: 0.1 10*3/uL (ref 0.0–0.7)
HEMATOCRIT: 47.5 % (ref 39.0–52.0)
HEMOGLOBIN: 16.5 g/dL (ref 13.0–17.0)
Lymphocytes Relative: 22 % (ref 12–46)
Lymphs Abs: 1.3 10*3/uL (ref 0.7–4.0)
MCH: 30.1 pg (ref 26.0–34.0)
MCHC: 34.7 g/dL (ref 30.0–36.0)
MCV: 86.7 fL (ref 78.0–100.0)
MONO ABS: 0.5 10*3/uL (ref 0.1–1.0)
MONOS PCT: 9 % (ref 3–12)
NEUTROS ABS: 4 10*3/uL (ref 1.7–7.7)
Neutrophils Relative %: 67 % (ref 43–77)
Platelets: 230 10*3/uL (ref 150–400)
RBC: 5.48 MIL/uL (ref 4.22–5.81)
RDW: 12.3 % (ref 11.5–15.5)
WBC: 5.8 10*3/uL (ref 4.0–10.5)

## 2013-11-13 LAB — RAPID URINE DRUG SCREEN, HOSP PERFORMED
AMPHETAMINES: NOT DETECTED
Barbiturates: NOT DETECTED
Benzodiazepines: NOT DETECTED
Cocaine: NOT DETECTED
Opiates: NOT DETECTED
TETRAHYDROCANNABINOL: NOT DETECTED

## 2013-11-13 LAB — COMPREHENSIVE METABOLIC PANEL
ALBUMIN: 4.1 g/dL (ref 3.5–5.2)
ALT: 13 U/L (ref 0–53)
AST: 15 U/L (ref 0–37)
Alkaline Phosphatase: 94 U/L (ref 39–117)
BUN: 12 mg/dL (ref 6–23)
CALCIUM: 9.8 mg/dL (ref 8.4–10.5)
CO2: 28 mEq/L (ref 19–32)
Chloride: 100 mEq/L (ref 96–112)
Creatinine, Ser: 1.07 mg/dL (ref 0.50–1.35)
GFR calc non Af Amer: 89 mL/min — ABNORMAL LOW (ref >90)
Glucose, Bld: 109 mg/dL — ABNORMAL HIGH (ref 70–99)
POTASSIUM: 4.2 meq/L (ref 3.7–5.3)
SODIUM: 138 meq/L (ref 137–147)
TOTAL PROTEIN: 7.8 g/dL (ref 6.0–8.3)
Total Bilirubin: 0.7 mg/dL (ref 0.3–1.2)

## 2013-11-13 LAB — URINALYSIS, ROUTINE W REFLEX MICROSCOPIC
GLUCOSE, UA: NEGATIVE mg/dL
HGB URINE DIPSTICK: NEGATIVE
Ketones, ur: 15 mg/dL — AB
Leukocytes, UA: NEGATIVE
Nitrite: NEGATIVE
PROTEIN: NEGATIVE mg/dL
SPECIFIC GRAVITY, URINE: 1.025 (ref 1.005–1.030)
Urobilinogen, UA: 1 mg/dL (ref 0.0–1.0)
pH: 5 (ref 5.0–8.0)

## 2013-11-13 LAB — LIPASE, BLOOD: Lipase: 18 U/L (ref 11–59)

## 2013-11-13 MED ORDER — MORPHINE SULFATE 2 MG/ML IJ SOLN
2.0000 mg | Freq: Once | INTRAMUSCULAR | Status: AC
  Administered 2013-11-13: 2 mg via INTRAVENOUS
  Filled 2013-11-13: qty 1

## 2013-11-13 MED ORDER — IOHEXOL 300 MG/ML  SOLN
100.0000 mL | Freq: Once | INTRAMUSCULAR | Status: AC | PRN
  Administered 2013-11-13: 100 mL via INTRAVENOUS

## 2013-11-13 MED ORDER — ALBUTEROL SULFATE HFA 108 (90 BASE) MCG/ACT IN AERS
2.0000 | INHALATION_SPRAY | RESPIRATORY_TRACT | Status: DC | PRN
  Filled 2013-11-13: qty 6.7

## 2013-11-13 MED ORDER — IOHEXOL 300 MG/ML  SOLN
50.0000 mL | Freq: Once | INTRAMUSCULAR | Status: AC | PRN
  Administered 2013-11-13: 50 mL via ORAL

## 2013-11-13 MED ORDER — ALBUTEROL SULFATE (2.5 MG/3ML) 0.083% IN NEBU: 2.5000 mg | INHALATION_SOLUTION | RESPIRATORY_TRACT | Status: DC | PRN

## 2013-11-13 MED ORDER — MORPHINE SULFATE 2 MG/ML IJ SOLN
2.0000 mg | INTRAMUSCULAR | Status: DC | PRN
  Administered 2013-11-13 – 2013-11-14 (×4): 2 mg via INTRAVENOUS
  Filled 2013-11-13 (×4): qty 1

## 2013-11-13 MED ORDER — SODIUM CHLORIDE 0.9 % IV BOLUS (SEPSIS)
1000.0000 mL | Freq: Once | INTRAVENOUS | Status: AC
  Administered 2013-11-13: 1000 mL via INTRAVENOUS

## 2013-11-13 MED ORDER — LORAZEPAM 2 MG/ML IJ SOLN
1.0000 mg | Freq: Once | INTRAMUSCULAR | Status: AC
  Administered 2013-11-13: 1 mg via INTRAVENOUS
  Filled 2013-11-13: qty 1

## 2013-11-13 MED ORDER — HEPARIN SODIUM (PORCINE) 5000 UNIT/ML IJ SOLN
5000.0000 [IU] | Freq: Three times a day (TID) | INTRAMUSCULAR | Status: DC
  Administered 2013-11-13 – 2013-11-15 (×5): 5000 [IU] via SUBCUTANEOUS
  Filled 2013-11-13 (×5): qty 1

## 2013-11-13 MED ORDER — ONDANSETRON HCL 4 MG/2ML IJ SOLN
4.0000 mg | Freq: Once | INTRAMUSCULAR | Status: AC
  Administered 2013-11-13: 4 mg via INTRAVENOUS
  Filled 2013-11-13: qty 2

## 2013-11-13 MED ORDER — POTASSIUM CHLORIDE IN NACL 20-0.9 MEQ/L-% IV SOLN
INTRAVENOUS | Status: DC
  Administered 2013-11-13 – 2013-11-14 (×2): via INTRAVENOUS

## 2013-11-13 MED ORDER — SODIUM CHLORIDE 0.9 % IJ SOLN
INTRAMUSCULAR | Status: AC
  Filled 2013-11-13: qty 1000

## 2013-11-13 MED ORDER — MILK AND MOLASSES ENEMA
1.0000 | Freq: Once | RECTAL | Status: AC
  Administered 2013-11-13: 250 mL via RECTAL

## 2013-11-13 MED ORDER — SENNOSIDES-DOCUSATE SODIUM 8.6-50 MG PO TABS
2.0000 | ORAL_TABLET | Freq: Two times a day (BID) | ORAL | Status: DC
  Administered 2013-11-13 – 2013-11-15 (×4): 2 via ORAL
  Filled 2013-11-13 (×4): qty 2

## 2013-11-13 MED ORDER — HYDROMORPHONE HCL PF 1 MG/ML IJ SOLN
1.0000 mg | Freq: Once | INTRAMUSCULAR | Status: AC
  Administered 2013-11-13: 1 mg via INTRAVENOUS
  Filled 2013-11-13: qty 1

## 2013-11-13 MED ORDER — ONDANSETRON HCL 4 MG PO TABS
4.0000 mg | ORAL_TABLET | Freq: Four times a day (QID) | ORAL | Status: DC | PRN
Start: 2013-11-13 — End: 2013-11-15
  Administered 2013-11-14: 4 mg via ORAL
  Filled 2013-11-13: qty 1

## 2013-11-13 MED ORDER — ONDANSETRON HCL 4 MG/2ML IJ SOLN
4.0000 mg | Freq: Four times a day (QID) | INTRAMUSCULAR | Status: DC | PRN
  Administered 2013-11-13 – 2013-11-14 (×2): 4 mg via INTRAVENOUS
  Filled 2013-11-13 (×2): qty 2

## 2013-11-13 NOTE — ED Notes (Signed)
Fleet enema admin by EDP with no relief.

## 2013-11-13 NOTE — H&P (Signed)
[facility] History and Physical  DHIREN AZIMI ZOX:[id number] DOB: 10-Jul-1981 DOA: 11/13/2013  Referring physician: Dr. Adriana Simas, ER. PCP: No primary provider on file.   Chief Complaint: Abdominal pain.  HPI: Travis Allen is a 33 y.o. male  This is a 33 year old man who is had problems with constipation on and off for several years. He said he opened his bowels yesterday but today he has not been able to open his bowels or pass urine. She has not had any nausea or vomiting. He does not have a fever. He does not take any opioid medications. He does not seem to a much fiber in his diet. Several attempts at fecal impaction in the emergency room have been unsuccessful and now he is being admitted for management of his symptoms.   Review of Systems:  Constitutional:  No weight loss, night sweats, Fevers, chills, fatigue.  HEENT:  No headaches, Difficulty swallowing,Tooth/dental problems,Sore throat,  No sneezing, itching, ear ache, nasal congestion, post nasal drip,  Cardio-vascular:  No chest pain, Orthopnea, PND, swelling in lower extremities, anasarca, dizziness, palpitations  Resp:  No shortness of breath with exertion or at rest. No excess mucus, no productive cough, No non-productive cough, No coughing up of blood.No change in color of mucus.No wheezing.No chest wall deformity  Skin:  no rash or lesions.  GU:  no dysuria, change in color of urine, no urgency or frequency. No flank pain.  Musculoskeletal:  No joint pain or swelling. No decreased range of motion. No back pain.  Psych:  No change in mood or affect. No depression or anxiety. No memory loss.   Past Medical History  Diagnosis Date  . Pneumothorax on left    Past Surgical History  Procedure Laterality Date  . Chest tube insertion    . Video assisted thoracoscopy  06/05/2012    Procedure: VIDEO ASSISTED THORACOSCOPY;  Surgeon: Kerin Perna, MD;  Location: [facility] OR;  Service: Thoracic;  Laterality: Right;    . Pleuradesis  06/05/2012    Procedure: Cheron Every;  Surgeon: Kerin Perna, MD;  Location: [facility] OR;  Service: Thoracic;  Laterality: Right;  Mechanical Pleuradesis  . Resection of apical bleb  06/05/2012    Procedure: RESECTION OF APICAL BLEB;  Surgeon: Kerin Perna, MD;  Location: [facility] OR;  Service: Thoracic;  Laterality: Right;   Social History:  reports that he has been smoking Cigarettes.  He has a 21 pack-year smoking history. He does not have any smokeless tobacco history on file. He reports that he drinks alcohol. He reports that he does not use illicit drugs.  No Known Allergies  History reviewed. No pertinent family history.   Prior to Admission medications   Medication Sig Start Date End Date Taking? Authorizing Provider  albuterol (PROVENTIL HFA;VENTOLIN HFA) 108 (90 BASE) MCG/ACT inhaler Inhale 2 puffs into the lungs every 4 (four) hours as needed for wheezing or shortness of breath. 06/09/12  Yes Erin Barrett, PA-C   Physical Exam: Filed Vitals:   11/13/13 1656  BP: 120/74  Pulse: 86  Temp: 97.7 F (36.5 C)  Resp: 18    BP 120/74  Pulse 86  Temp(Src) 97.7 F (36.5 C) (Oral)  Resp 18  Ht 6\' 4"  (1.93 m)  Wt 55.509 kg (122 lb 6 oz)  BMI 14.90 kg/m2  SpO2 97%  General:  Appears uncomfortable. Eyes: PERRL, normal lids, irises & conjunctiva ENT: grossly normal hearing, lips & tongue Neck: no LAD, masses or thyromegaly Cardiovascular: RRR, no  m/r/g. No LE edema. Telemetry: SR, no arrhythmias  Respiratory: CTA bilaterally, no w/r/r. Normal respiratory effort. Abdomen: soft, tender abdomen the left lower quadrant. No signs of an acute abdomen however. Skin: no rash or induration seen on limited exam Musculoskeletal: grossly normal tone BUE/BLE Psychiatric: grossly normal mood and affect, speech fluent and appropriate Neurologic: grossly non-focal.          Labs on Admission:  Basic Metabolic Panel:  Recent Labs Lab 11/13/13 1135  NA 138  K 4.2  CL  100  CO2 28  GLUCOSE 109*  BUN 12  CREATININE 1.07  CALCIUM 9.8   Liver Function Tests:  Recent Labs Lab 11/13/13 1135  AST 15  ALT 13  ALKPHOS 94  BILITOT 0.7  PROT 7.8  ALBUMIN 4.1    Recent Labs Lab 11/13/13 1135  LIPASE 18    CBC:  Recent Labs Lab 11/13/13 1135  WBC 5.8  NEUTROABS 4.0  HGB 16.5  HCT 47.5  MCV 86.7  PLT 230      Radiological Exams on Admission: Ct Abdomen Pelvis W Contrast  11/13/2013   CLINICAL DATA:  Two day history of lower abdominal pain  EXAM: CT ABDOMEN AND PELVIS WITH CONTRAST  TECHNIQUE: Multidetector CT imaging of the abdomen and pelvis was performed using the standard protocol following bolus administration of intravenous contrast.  CONTRAST:  50mL OMNIPAQUE IOHEXOL 300 MG/ML SOLN, 100mL OMNIPAQUE IOHEXOL 300 MG/ML SOLN  COMPARISON:  Abdominal radiographs 06/13/2009, 12/11/2003  FINDINGS: Lower Chest: The lung bases are clear. Visualized cardiac structures are within normal limits for size. No pericardial effusion. Refluxed oral contrast noted in the visualized distal thoracic esophagus.  Abdomen: Distension of the stomach which contains nearly all of the ingested oral contrast material. No definite obstructing mass. A small amounts of contrast material has passed into the small bowel. Unremarkable duodenum, spleen, adrenal glands, pancreas and liver save for a sub cm low-attenuation focus in hepatic segment 4B which is too small to accurately characterize but statistically highly likely a benign cyst. Gallbladder is unremarkable. No intra or extrahepatic biliary ductal dilatation. Unremarkable appearance of the bilateral kidneys. No focal solid lesion, hydronephrosis or nephrolithiasis.  Massive the rectal stool ball measuring 11 x 10 x 9.4 cm. There is likely chronic distension of the rectal vault. All additionally, there is a large volume of formed stool throughout the remainder of the colon. The appendix is normal. No evidence of bowel  obstruction. No free fluid or free air.  Pelvis: The pelvis is remarkable only for anterior displacement of the pelvic contents secondary to the very large rectal stool ball. Of note, similar findings were present on a Prior abdominal radiograph from 2010.  Bones/Soft Tissues: No acute fracture or aggressive appearing lytic or blastic osseous lesion.  Vascular: No significant atherosclerotic vascular disease, aneurysmal dilatation or acute abnormality. Retro aortic left renal vein noted incidentally.  IMPRESSION: 1. CT findings are most consistent with longstanding chronic severe constipation and fecal impaction. There is a massive rectal stool ball in a chronically dilated upper rectum. The distal rectum and anus appear normal in caliber. Additionally, there is a large volume of formed stool throughout the entire colon. Similar findings were present on an abdominal x-rays from November of 2010, and May of 2005. 2. The appendix is normal.   Electronically Signed   By: Malachy MoanHeath  McCullough M.D.   On: 11/13/2013 14:09      Assessment/Plan   1. Abdominal pain secondary to severe constipation with massive  rectal stool ball in a chronically dilated upper rectum. Please see CT scan above.  Plan: 1. Admit to medical floor. 2. Intravenous fluids. 3. Milk of molasses enema to see if this will help. 4. Senokot S. 5. Consider gastroenterology consultation if he does not improve.  Other recommendations will depend on patient's hospital progress.   Code Status: Full code.  Family Communication: I discussed the plan with patient at the bedside.   Disposition Plan: Home when medically stable, most likely tomorrow.  Time spent: 30 minutes.  Wilson SingerNimish C Gosrani [facility] Pager [phone].

## 2013-11-13 NOTE — ED Provider Notes (Signed)
CSN: [id number]     Arrival date & time 11/13/13  1042 History  This chart was scribed for Donnetta Hutching, MD by Quintella Reichert, ED scribe.  This patient was seen in room APA15/APA15 and the patient's care was started at 11:31 AM.   Chief Complaint  Patient presents with  . Abdominal Pain    The history is provided by the patient and a significant other. No language interpreter was used.    HPI Comments: Travis Allen is a 33 y.o. male who presents to the Emergency Department complaining of severe bilateral lower abdominal pain that began about 24 hours ago.  Pt reports his pain began at the [facility] and has since moved to the [facility].  He also notes that today he was trying to use the restroom and was unable to urinate or move his bowels.  He denies associated vomiting.  Pt denies h/o abdominal surgery or known h/o diagnosed GI issues.  However his girlfriend believes that pt has "Crohn's or some other sort of IBS-related issue," as for some time he has had sporadic episodes of sudden-onset diarrhea that lasts from 1 day to several weeks.  This is the first time that one of his flare-ups has been associated with pain.  He can have weeks or months between symptoms and there is no known specific trigger, but his symptoms are severe and incapacitating when they do occur.  Girlfriend does note that during these flare-ups his symptoms are worsened by eating, and consequently he frequently goes several days without eating.  Pt reports he has lost a significant amount of weight.  He has no known h/o DM or HTN.  He states he had "surgery on my right lung" due to "lung collapsed twice" and "they removed the bottom right lung."  Surgery was at [facility] in 2013.   Past Medical History  Diagnosis Date  . Pneumothorax on left     Past Surgical History  Procedure Laterality Date  . Chest tube insertion    . Video assisted thoracoscopy  06/05/2012    Procedure: VIDEO ASSISTED THORACOSCOPY;  Surgeon: Kerin Perna, MD;  Location: [facility] OR;  Service: Thoracic;  Laterality: Right;  . Pleuradesis  06/05/2012    Procedure: Cheron Every;  Surgeon: Kerin Perna, MD;  Location: [facility] OR;  Service: Thoracic;  Laterality: Right;  Mechanical Pleuradesis  . Resection of apical bleb  06/05/2012    Procedure: RESECTION OF APICAL BLEB;  Surgeon: Kerin Perna, MD;  Location: [facility] OR;  Service: Thoracic;  Laterality: Right;    History reviewed. No pertinent family history.   History  Substance Use Topics  . Smoking status: Current Every Day Smoker -- 1.00 packs/day for 21 years    Types: Cigarettes  . Smokeless tobacco: Not on file  . Alcohol Use: Yes     Comment: occasionally     Review of Systems A complete 10 system review of systems was obtained and all systems are negative except as noted in the HPI and PMH.     Allergies  Review of patient's allergies indicates no known allergies.  Home Medications   Prior to Admission medications   Medication Sig Start Date End Date Taking? Authorizing Provider  albuterol (PROVENTIL HFA;VENTOLIN HFA) 108 (90 BASE) MCG/ACT inhaler Inhale 2 puffs into the lungs every 4 (four) hours as needed for wheezing or shortness of breath. 06/09/12  Yes Erin Barrett, PA-C   BP 121/75  Pulse 98  Temp(Src) 97.7 F (36.5  C) (Oral)  Resp 20  Ht 6\' 4"  (1.93 m)  Wt 122 lb 6 oz (55.509 kg)  BMI 14.90 kg/m2  SpO2 99%  Physical Exam  Nursing note and vitals reviewed. Constitutional: He is oriented to person, place, and time. He appears well-developed.  Uncomfortable appearing, thin, emaciated.  HENT:  Head: Normocephalic and atraumatic.  Eyes: Conjunctivae and EOM are normal. Pupils are equal, round, and reactive to light.  Neck: Normal range of motion. Neck supple.  Cardiovascular: Normal rate, regular rhythm and normal heart sounds.   Pulmonary/Chest: Effort normal and breath sounds normal.  Abdominal: Soft. Bowel sounds are normal. There is tenderness (lower  abdomen bilaterally).  Musculoskeletal: Normal range of motion.  Neurological: He is alert and oriented to person, place, and time.  Skin: Skin is warm and dry.  Psychiatric: He has a normal mood and affect. His behavior is normal.    ED Course  Procedures (including critical care time)................Marland Kitchen.Procedure note:    Digital disimpaction attempted with index finger. Large stool ball noted at tip of glove. No stool extracted. Fleets enema administered.  DIAGNOSTIC STUDIES: Oxygen Saturation is 99% on room air, normal by my interpretation.    COORDINATION OF CARE: 11:35 AM-Discussed treatment plan which includes pain management, CT-abdomen and labs with pt at bedside and pt agreed to plan.    Results for orders placed during the hospital encounter of 11/13/13  COMPREHENSIVE METABOLIC PANEL      Result Value Ref Range   Sodium 138  137 - 147 mEq/L   Potassium 4.2  3.7 - 5.3 mEq/L   Chloride 100  96 - 112 mEq/L   CO2 28  19 - 32 mEq/L   Glucose, Bld 109 (*) 70 - 99 mg/dL   BUN 12  6 - 23 mg/dL   Creatinine, Ser 1.301.07  0.50 - 1.35 mg/dL   Calcium 9.8  8.4 - 86.510.5 mg/dL   Total Protein 7.8  6.0 - 8.3 g/dL   Albumin 4.1  3.5 - 5.2 g/dL   AST 15  0 - 37 U/L   ALT 13  0 - 53 U/L   Alkaline Phosphatase 94  39 - 117 U/L   Total Bilirubin 0.7  0.3 - 1.2 mg/dL   GFR calc non Af Amer 89 (*) >90 mL/min   GFR calc Af Amer >90  >90 mL/min  CBC WITH DIFFERENTIAL      Result Value Ref Range   WBC 5.8  4.0 - 10.5 K/uL   RBC 5.48  4.22 - 5.81 MIL/uL   Hemoglobin 16.5  13.0 - 17.0 g/dL   HCT 78.447.5  69.639.0 - 29.552.0 %   MCV 86.7  78.0 - 100.0 fL   MCH 30.1  26.0 - 34.0 pg   MCHC 34.7  30.0 - 36.0 g/dL   RDW 28.412.3  13.211.5 - 44.015.5 %   Platelets 230  150 - 400 K/uL   Neutrophils Relative % 67  43 - 77 %   Neutro Abs 4.0  1.7 - 7.7 K/uL   Lymphocytes Relative 22  12 - 46 %   Lymphs Abs 1.3  0.7 - 4.0 K/uL   Monocytes Relative 9  3 - 12 %   Monocytes Absolute 0.5  0.1 - 1.0 K/uL   Eosinophils  Relative 2  0 - 5 %   Eosinophils Absolute 0.1  0.0 - 0.7 K/uL   Basophils Relative 0  0 - 1 %   Basophils Absolute 0.0  0.0 -  0.1 K/uL  LIPASE, BLOOD      Result Value Ref Range   Lipase 18  11 - 59 U/L     Ct Abdomen Pelvis W Contrast  11/13/2013   CLINICAL DATA:  Two day history of lower abdominal pain  EXAM: CT ABDOMEN AND PELVIS WITH CONTRAST  TECHNIQUE: Multidetector CT imaging of the abdomen and pelvis was performed using the standard protocol following bolus administration of intravenous contrast.  CONTRAST:  50mL OMNIPAQUE IOHEXOL 300 MG/ML SOLN, 100mL OMNIPAQUE IOHEXOL 300 MG/ML SOLN  COMPARISON:  Abdominal radiographs 06/13/2009, 12/11/2003  FINDINGS: Lower Chest: The lung bases are clear. Visualized cardiac structures are within normal limits for size. No pericardial effusion. Refluxed oral contrast noted in the visualized distal thoracic esophagus.  Abdomen: Distension of the stomach which contains nearly all of the ingested oral contrast material. No definite obstructing mass. A small amounts of contrast material has passed into the small bowel. Unremarkable duodenum, spleen, adrenal glands, pancreas and liver save for a sub cm low-attenuation focus in hepatic segment 4B which is too small to accurately characterize but statistically highly likely a benign cyst. Gallbladder is unremarkable. No intra or extrahepatic biliary ductal dilatation. Unremarkable appearance of the bilateral kidneys. No focal solid lesion, hydronephrosis or nephrolithiasis.  Massive the rectal stool ball measuring 11 x 10 x 9.4 cm. There is likely chronic distension of the rectal vault. All additionally, there is a large volume of formed stool throughout the remainder of the colon. The appendix is normal. No evidence of bowel obstruction. No free fluid or free air.  Pelvis: The pelvis is remarkable only for anterior displacement of the pelvic contents secondary to the very large rectal stool ball. Of note, similar  findings were present on a Prior abdominal radiograph from 2010.  Bones/Soft Tissues: No acute fracture or aggressive appearing lytic or blastic osseous lesion.  Vascular: No significant atherosclerotic vascular disease, aneurysmal dilatation or acute abnormality. Retro aortic left renal vein noted incidentally.  IMPRESSION: 1. CT findings are most consistent with longstanding chronic severe constipation and fecal impaction. There is a massive rectal stool ball in a chronically dilated upper rectum. The distal rectum and anus appear normal in caliber. Additionally, there is a large volume of formed stool throughout the entire colon. Similar findings were present on an abdominal x-rays from November of 2010, and May of 2005. 2. The appendix is normal.   Electronically Signed   By: Malachy MoanHeath  McCullough M.D.   On: 11/13/2013 14:09      MDM   Final diagnoses:  Fecal impaction    Patient has profound fecal impaction. I was unable to disimpact digitally. Discussed with Dr. Karilyn CotaGosrani.  He will evaluate   I personally performed the services described in this documentation, which was scribed in my presence. The recorded information has been reviewed and is accurate.    Donnetta HutchingBrian Grasiela Jonsson, MD 11/13/13 [phone]

## 2013-11-13 NOTE — ED Notes (Signed)
Hospitalist at bedside 

## 2013-11-13 NOTE — ED Notes (Addendum)
Pt c/o lower abd pain with diarrhea and chills that started yesterday, has had problems with diarrhea intermittent since January,

## 2013-11-13 NOTE — Progress Notes (Signed)
Pt received MOM enema with small amt of brown soft brown stool as result.  Pt c/o abdominal pain 9 on a scale of 1-10.  Notified Dr Karilyn CotaGosrani, no orders for pain medication at this time, will repeat enema for more result.

## 2013-11-13 NOTE — ED Notes (Signed)
Per EDP size 8 sterile gloves, sterile lubricant, and chux at bedside.

## 2013-11-13 NOTE — ED Notes (Signed)
Attempted report x1 No answer

## 2013-11-13 NOTE — Progress Notes (Signed)
Patient able to give urine sample at this time.  Foley catheter refused at this time.

## 2013-11-14 DIAGNOSIS — R109 Unspecified abdominal pain: Secondary | ICD-10-CM

## 2013-11-14 LAB — COMPREHENSIVE METABOLIC PANEL
ALBUMIN: 3.9 g/dL (ref 3.5–5.2)
ALT: 21 U/L (ref 0–53)
AST: 21 U/L (ref 0–37)
Alkaline Phosphatase: 92 U/L (ref 39–117)
BILIRUBIN TOTAL: 0.7 mg/dL (ref 0.3–1.2)
BUN: 12 mg/dL (ref 6–23)
CO2: 25 meq/L (ref 19–32)
CREATININE: 0.92 mg/dL (ref 0.50–1.35)
Calcium: 9.5 mg/dL (ref 8.4–10.5)
Chloride: 99 mEq/L (ref 96–112)
GFR calc Af Amer: >90 mL/min (ref >90)
GFR calc non Af Amer: >90 mL/min (ref >90)
Glucose, Bld: 92 mg/dL (ref 70–99)
POTASSIUM: 4.2 meq/L (ref 3.7–5.3)
Sodium: 138 mEq/L (ref 137–147)
Total Protein: 7.4 g/dL (ref 6.0–8.3)

## 2013-11-14 LAB — CBC
HCT: 45 % (ref 39.0–52.0)
Hemoglobin: 15.1 g/dL (ref 13.0–17.0)
MCH: 29.5 pg (ref 26.0–34.0)
MCHC: 33.6 g/dL (ref 30.0–36.0)
MCV: 88.1 fL (ref 78.0–100.0)
Platelets: 221 10*3/uL (ref 150–400)
RBC: 5.11 MIL/uL (ref 4.22–5.81)
RDW: 12.3 % (ref 11.5–15.5)
WBC: 9.4 10*3/uL (ref 4.0–10.5)

## 2013-11-14 LAB — TSH: TSH: 1.83 u[IU]/mL (ref 0.350–4.500)

## 2013-11-14 MED ORDER — LUBIPROSTONE 24 MCG PO CAPS
24.0000 ug | ORAL_CAPSULE | Freq: Two times a day (BID) | ORAL | Status: DC
  Administered 2013-11-14 – 2013-11-15 (×3): 24 ug via ORAL
  Filled 2013-11-14 (×7): qty 1

## 2013-11-14 MED ORDER — BISACODYL 10 MG RE SUPP
10.0000 mg | Freq: Once | RECTAL | Status: DC
  Filled 2013-11-14: qty 1

## 2013-11-14 MED ORDER — PEG 3350-KCL-NA BICARB-NACL 420 G PO SOLR
4000.0000 mL | Freq: Once | ORAL | Status: AC
  Administered 2013-11-14: 4000 mL via ORAL
  Filled 2013-11-14: qty 4000

## 2013-11-14 MED ORDER — DOCUSATE SODIUM 100 MG PO CAPS
100.0000 mg | ORAL_CAPSULE | Freq: Two times a day (BID) | ORAL | Status: AC
Start: 2013-11-14 — End: ?

## 2013-11-14 MED ORDER — KETOROLAC TROMETHAMINE 15 MG/ML IJ SOLN
15.0000 mg | Freq: Four times a day (QID) | INTRAMUSCULAR | Status: DC | PRN
  Filled 2013-11-14: qty 1

## 2013-11-14 MED ORDER — MAGNESIUM CITRATE PO SOLN
1.0000 | ORAL | Status: AC
  Administered 2013-11-14: 1 via ORAL
  Filled 2013-11-14: qty 296

## 2013-11-14 NOTE — Progress Notes (Signed)
[facility] PROGRESS NOTE  Travis Allen WUJ:[id number] DOB: Oct 23, 1980 DOA: 11/13/2013 PCP: No primary provider on file.  Assessment/Plan: 1. Abdominal pain 1. Likely secondary to constipation 2. Marked amounts of stool noted on abd CT 3. Will check TSH 4. Cont bowel regimen as needed - dulcolax suppository with golytley 5. May consider amitiza as pt noted to have constipation "since age 16mos"  2. DVT prophylaxis 1. heparin  Code Status: Full Family Communication: Pt in room (indicate person spoken with, relationship, and if by phone, the number) Disposition Plan: Pending   Consultants:  none  Procedures:  none  Antibiotics:  none (indicate start date, and stop date if known)  HPI/Subjective: Still reports abd pain and distension. Reports "some" bm overnight. Decreased flatus this AM.  Objective: Filed Vitals:   11/13/13 1800 11/13/13 2149 11/13/13 2325 11/14/13 0454  BP: 106/73  119/66 119/73  Pulse: 71 78 90 92  Temp: 97.8 F (36.6 C)  99 F (37.2 C) 98.8 F (37.1 C)  TempSrc: Oral  Oral Oral  Resp: 20 16 20 18   Height: 6\' 4"  (1.93 m)     Weight: 62.8 kg (138 lb 7.2 oz)     SpO2: 100% 97% 97% 95%    Intake/Output Summary (Last 24 hours) at 11/14/13 1002 Last data filed at 11/14/13 0500  Gross per 24 hour  Intake      0 ml  Output      4 ml  Net     -4 ml   Filed Weights   11/13/13 1100 11/13/13 1800  Weight: 55.509 kg (122 lb 6 oz) 62.8 kg (138 lb 7.2 oz)    Exam:   General:  Awake, in nad  Cardiovascular: regular, s1, s2  Respiratory: normal resp effort, no wheezing  Abdomen: distended, decreased BS  Musculoskeletal: perfused, no clubbing   Data Reviewed: Basic Metabolic Panel:  Recent Labs Lab 11/13/13 1135 11/14/13 0537  NA 138 138  K 4.2 4.2  CL 100 99  CO2 28 25  GLUCOSE 109* 92  BUN 12 12  CREATININE 1.07 0.92  CALCIUM 9.8 9.5   Liver Function Tests:  Recent Labs Lab 11/13/13 1135 11/14/13 0537  AST 15  21  ALT 13 21  ALKPHOS 94 92  BILITOT 0.7 0.7  PROT 7.8 7.4  ALBUMIN 4.1 3.9    Recent Labs Lab 11/13/13 1135  LIPASE 18   No results found for this basename: AMMONIA,  in the last 168 hours CBC:  Recent Labs Lab 11/13/13 1135 11/14/13 0537  WBC 5.8 9.4  NEUTROABS 4.0  --   HGB 16.5 15.1  HCT 47.5 45.0  MCV 86.7 88.1  PLT 230 221   Cardiac Enzymes: No results found for this basename: CKTOTAL, CKMB, CKMBINDEX, TROPONINI,  in the last 168 hours BNP (last 3 results) No results found for this basename: PROBNP,  in the last 8760 hours CBG: No results found for this basename: GLUCAP,  in the last 168 hours  No results found for this or any previous visit (from the past 240 hour(s)).   Studies: Ct Abdomen Pelvis W Contrast  11/13/2013   CLINICAL DATA:  Two day history of lower abdominal pain  EXAM: CT ABDOMEN AND PELVIS WITH CONTRAST  TECHNIQUE: Multidetector CT imaging of the abdomen and pelvis was performed using the standard protocol following bolus administration of intravenous contrast.  CONTRAST:  50mL OMNIPAQUE IOHEXOL 300 MG/ML SOLN, 100mL OMNIPAQUE IOHEXOL 300 MG/ML SOLN  COMPARISON:  Abdominal radiographs  06/13/2009, 12/11/2003  FINDINGS: Lower Chest: The lung bases are clear. Visualized cardiac structures are within normal limits for size. No pericardial effusion. Refluxed oral contrast noted in the visualized distal thoracic esophagus.  Abdomen: Distension of the stomach which contains nearly all of the ingested oral contrast material. No definite obstructing mass. A small amounts of contrast material has passed into the small bowel. Unremarkable duodenum, spleen, adrenal glands, pancreas and liver save for a sub cm low-attenuation focus in hepatic segment 4B which is too small to accurately characterize but statistically highly likely a benign cyst. Gallbladder is unremarkable. No intra or extrahepatic biliary ductal dilatation. Unremarkable appearance of the bilateral  kidneys. No focal solid lesion, hydronephrosis or nephrolithiasis.  Massive the rectal stool ball measuring 11 x 10 x 9.4 cm. There is likely chronic distension of the rectal vault. All additionally, there is a large volume of formed stool throughout the remainder of the colon. The appendix is normal. No evidence of bowel obstruction. No free fluid or free air.  Pelvis: The pelvis is remarkable only for anterior displacement of the pelvic contents secondary to the very large rectal stool ball. Of note, similar findings were present on a Prior abdominal radiograph from 2010.  Bones/Soft Tissues: No acute fracture or aggressive appearing lytic or blastic osseous lesion.  Vascular: No significant atherosclerotic vascular disease, aneurysmal dilatation or acute abnormality. Retro aortic left renal vein noted incidentally.  IMPRESSION: 1. CT findings are most consistent with longstanding chronic severe constipation and fecal impaction. There is a massive rectal stool ball in a chronically dilated upper rectum. The distal rectum and anus appear normal in caliber. Additionally, there is a large volume of formed stool throughout the entire colon. Similar findings were present on an abdominal x-rays from November of 2010, and May of 2005. 2. The appendix is normal.   Electronically Signed   By: Malachy MoanHeath  McCullough M.D.   On: 11/13/2013 14:09    Scheduled Meds: . heparin  5,000 Units Subcutaneous 3 times per day  . senna-docusate  2 tablet Oral BID   Continuous Infusions: . 0.9 % NaCl with KCl 20 mEq / L 75 mL/hr at 11/13/13 1901    Active Problems:   Fecal impaction   Abdominal pain   Abdominal pain, generalized    Time spent: 35min    [name]  [facility] Pager [phone]. If 7PM-7AM, please contact night-coverage at [email or web address], password [email or web address] 11/14/2013, 10:02 AM  LOS: 1 day

## 2013-11-15 MED ORDER — LUBIPROSTONE 24 MCG PO CAPS: 24.0000 ug | ORAL_CAPSULE | Freq: Two times a day (BID) | ORAL | Status: AC

## 2013-11-15 NOTE — Discharge Summary (Signed)
Physician Discharge Summary  Travis AbrahamsChadwick E Fitzhenry JYN:[id number] DOB: 1980-09-03 DOA: 11/13/2013  PCP: No primary provider on file.  Admit date: 11/13/2013 Discharge date: 11/15/2013  Time spent: 40 minutes  Recommendations for Outpatient Follow-up:  1. [facility]. Recommend reinforcing high fiber diet.   Discharge Diagnoses:  Active Problems:   Fecal impaction   Abdominal pain   Abdominal pain, generalized   Discharge Condition: stable  Diet recommendation: high fiber  Filed Weights   11/13/13 1100 11/13/13 1800 11/15/13 0517  Weight: 55.509 kg (122 lb 6 oz) 62.8 kg (138 lb 7.2 oz) 62 kg (136 lb 11 oz)    History of present illness:  Travis Allen is a 33 y.o. male  had problems with constipation on and off for several years. He said he opened his bowels day prior to presentation but on 4/25 he had not been able to open his bowels or pass urine. He had not had any nausea or vomiting. He did not have a fever. He denied any opioid medications. He does not seem to eat much fiber in his diet. Several attempts at fecal impaction in the emergency room were unsuccessful and he was admitted for management of his symptoms.   Hospital Course:  Abdominal pain secondary to severe constipation with massive rectal stool ball in a chronically dilated upper rectum. CT abdomen reveals findings  most consistent with longstanding chronic severe constipation and fecal impaction. There is a massive rectal stool ball in a chronically dilated upper rectum. The distal rectum and anus appear normal in caliber. Additionally, there is a large volume  of formed stool throughout the entire colon. Similar findings were  present on an abdominal x-rays from November of 2010, and May of  2005. He was admitted and provided with milk of molasses enema, senekot as well as dulcolax suppository and golytley. He was started on [place]. He had at least 10 BM  during his hospitalization. On day of discharge pain resolved and exam benign. He was provided with education regarding high fiber diet and information on follow up should he go 2 or more days without BM or has persistent loose stool.    Procedures: none Consultations:  none  Discharge Exam: Filed Vitals:   11/15/13 0517  BP: 106/57  Pulse: 72  Temp: 98 F (36.7 C)  Resp: 16    General: thin NAD Cardiovascular: RRR no m/g/r no Le edema  Respiratory: normal effort BS slightly coarse. No crackles or wheeze Abdomen: flat soft +BS non-tender to palpation  Discharge Instructions You were cared for by a hospitalist during your hospital stay. If you have any questions about your discharge medications or the care you received while you were in the hospital after you are discharged, you can call the unit and asked to speak with the hospitalist on call if the hospitalist that took care of you is not available. Once you are discharged, your primary care physician will handle any further medical issues. Please note that NO REFILLS for any discharge medications will be authorized once you are discharged, as it is imperative that you return to your primary care physician (or establish a relationship with a primary care physician if you do not have one) for your aftercare needs so that they can reassess your need for medications and monitor your lab values.     Medication List         albuterol 108 (90 BASE)  MCG/ACT inhaler  Commonly known as:  PROVENTIL HFA;VENTOLIN HFA  Inhale 2 puffs into the lungs every 4 (four) hours as needed for wheezing or shortness of breath.     docusate sodium 100 MG capsule  Commonly known as:  COLACE  Take 1 capsule (100 mg total) by mouth 2 (two) times daily.     lubiprostone 24 MCG capsule  Commonly known as:  AMITIZA  Take 1 capsule (24 mcg total) by mouth 2 (two) times daily with a meal.       No Known Allergies     Follow-up Information   Follow  up with Follow up with PCP in 1-2 weeks. Schedule an appointment as soon as possible for a visit in 1 week.       The results of significant diagnostics from this hospitalization (including imaging, microbiology, ancillary and laboratory) are listed below for reference.    Significant Diagnostic Studies: Ct Abdomen Pelvis W Contrast  11/13/2013   CLINICAL DATA:  Two day history of lower abdominal pain  EXAM: CT ABDOMEN AND PELVIS WITH CONTRAST  TECHNIQUE: Multidetector CT imaging of the abdomen and pelvis was performed using the standard protocol following bolus administration of intravenous contrast.  CONTRAST:  50mL OMNIPAQUE IOHEXOL 300 MG/ML SOLN, OMNIPAQUE IOHEXOL 300 MG/ML SOLN  COMPARISON:  Abdominal radiographs 06/13/2009, 12/11/2003  FINDINGS: Lower Chest: The lung bases are clear. Visualized cardiac structures are within normal limits for size. No pericardial effusion. Refluxed oral contrast noted in the visualized distal thoracic esophagus.  Abdomen: Distension of the stomach which contains nearly all of the ingested oral contrast material. No definite obstructing mass. A small amounts of contrast material has passed into the small bowel. Unremarkable duodenum, spleen, adrenal glands, pancreas and liver save for a sub cm low-attenuation focus in hepatic segment 4B which is too small to accurately characterize but statistically highly likely a benign cyst. Gallbladder is unremarkable. No intra or extrahepatic biliary ductal dilatation. Unremarkable appearance of the bilateral kidneys. No focal solid lesion, hydronephrosis or nephrolithiasis.  Massive the rectal stool ball measuring 11 x 10 x 9.4 cm. There is likely chronic distension of the rectal vault. All additionally, there is a large volume of formed stool throughout the remainder of the colon. The appendix is normal. No evidence of bowel obstruction. No free fluid or free air.  Pelvis: The pelvis is remarkable only for anterior  displacement of the pelvic contents secondary to the very large rectal stool ball. Of note, similar findings were present on a Prior abdominal radiograph from 2010.  Bones/Soft Tissues: No acute fracture or aggressive appearing lytic or blastic osseous lesion.  Vascular: No significant atherosclerotic vascular disease, aneurysmal dilatation or acute abnormality. Retro aortic left renal vein noted incidentally.  IMPRESSION: 1. CT findings are most consistent with longstanding chronic severe constipation and fecal impaction. There is a massive rectal stool ball in a chronically dilated upper rectum. The distal rectum and anus appear normal in caliber. Additionally, there is a large volume of formed stool throughout the entire colon. Similar findings were present on an abdominal x-rays from November of 2010, and May of 2005. 2. The appendix is normal.   Electronically Signed   By: Malachy Moan M.D.   On: 11/13/2013 14:09    Microbiology: No results found for this or any previous visit (from the past 240 hour(s)).   Labs: Basic Metabolic Panel:  Recent Labs Lab 11/13/13 1135 11/14/13 0537  NA 138 138  K 4.2 4.2  CL 100 99  CO2 28 25  GLUCOSE 109* 92  BUN 12 12  CREATININE 1.07 0.92  CALCIUM 9.8 9.5   Liver Function Tests:  Recent Labs Lab 11/13/13 1135 11/14/13 0537  AST 15 21  ALT 13 21  ALKPHOS 94 92  BILITOT 0.7 0.7  PROT 7.8 7.4  ALBUMIN 4.1 3.9    Recent Labs Lab 11/13/13 1135  LIPASE 18   No results found for this basename: AMMONIA,  in the last 168 hours CBC:  Recent Labs Lab 11/13/13 1135 11/14/13 0537  WBC 5.8 9.4  NEUTROABS 4.0  --   HGB 16.5 15.1  HCT 47.5 45.0  MCV 86.7 88.1  PLT 230 221   Cardiac Enzymes: No results found for this basename: CKTOTAL, CKMB, CKMBINDEX, TROPONINI,  in the last 168 hours BNP: BNP (last 3 results) No results found for this basename: PROBNP,  in the last 8760 hours CBG: No results found for this basename: GLUCAP,   in the last 168 hours     Signed:  Gwenyth BenderKaren M Black  [facility] 11/15/2013, 10:35 AM   Patient seen and examined. Agree with above note by Toya SmothersKaren Black, NP. Patient admitted with severe constipation and fecal impaction. Improved with multiple BMs after enema and bowel regimen. Instructed on a high fiber diet. OK for DC home today.  Peggye PittEstela Hernandez, MD [facility] Pager: [phone]

## 2013-11-15 NOTE — Progress Notes (Signed)
INITIAL NUTRITION ASSESSMENT  DOCUMENTATION CODES Per approved criteria  -Severe malnutrition in the context of chronic illness -Underweight   Pt meets criteria for severe MALNUTRITION in the context of chronic illness as evidenced by severe muscle and fat depletion, <75% estimated energy intake x 1 month.  INTERVENTION: Resource Breeze po TID, each supplement provides 250 kcal and 9 grams of protein  NUTRITION DIAGNOSIS: Inadequate oral intake related to variable appetite as evidenced by diet hx, hx wt loss, underweight status.   Goal: Pt will meet >90% of estimated nutritional needs  Monitor:  Diet advancement, PO intake, labs, skin assessments, I/O's  Reason for Assessment: low BMI: 16.9  33 y.o. male  Admitting Dx: Fecal impaction  ASSESSMENT: Pt admitted for fecal impaction and abdominal pain secondary to severe constipation. He reports a variable appetite PTA ("sometimes I don't eat much and sometimes I eat as much as a horse"). He reports UBW of 140-150#, which he reports he last weighed more than one year ago. Wt hx confirms this and wt changes are not significant for time frame.  Currently, he reports that his appetite is very good (PO: 100%) and is desiring diet advancement.  He is willing to try nutritional supplements given his wt loss and variable intake.  Nutrition Focused Physical Exam:  Subcutaneous Fat:  Orbital Region: moderate depletion Upper Arm Region: severe depletion Thoracic and Lumbar Region: severe deletion  Muscle:  Temple Region: WDL Clavicle Bone Region: severe depletion Clavicle and Acromion Bone Region: severe depletion Scapular Bone Region: moderate depletion Dorsal Hand: moderate depletion Patellar Region: severe depletion Anterior Thigh Region: severe depletion Posterior Calf Region: severe depletion  Edema: none present   Height: Ht Readings from Last 1 Encounters:  11/13/13 6\' 4"  (1.93 m)    Weight: Wt Readings from Last 1  Encounters:  11/15/13 136 lb 11 oz (62 kg)    Ideal Body Weight: 202#  % Ideal Body Weight: 67%  Wt Readings from Last 10 Encounters:  11/15/13 136 lb 11 oz (62 kg)  06/24/12 140 lb (63.504 kg)  06/05/12 141 lb 5 oz (64.1 kg)  06/05/12 141 lb 5 oz (64.1 kg)  03/23/12 143 lb 4.8 oz (65 kg)  12/18/11 150 lb (68.04 kg)  08/28/11 140 lb (63.504 kg)  02/13/11 150 lb (68.04 kg)    Usual Body Weight: 145#  % Usual Body Weight: 94%  BMI:  Body mass index is 16.64 kg/(m^2). Meets criteria for underweight.   Estimated Nutritional Needs: Kcal: 1860-2170 daily Protein: 62-93 grams daily Fluid: 1.9-2.2 L daily  Skin: WDL  Diet Order: Clear Liquid  EDUCATION NEEDS: -Education needs addressed   Intake/Output Summary (Last 24 hours) at 11/15/13 1052 Last data filed at 11/15/13 0900  Gross per 24 hour  Intake    720 ml  Output      0 ml  Net    720 ml    Last BM: 11/15/13  Labs:   Recent Labs Lab 11/13/13 1135 11/14/13 0537  NA 138 138  K 4.2 4.2  CL 100 99  CO2 28 25  BUN 12 12  CREATININE 1.07 0.92  CALCIUM 9.8 9.5  GLUCOSE 109* 92    CBG (last 3)  No results found for this basename: GLUCAP,  in the last 72 hours  Scheduled Meds: . bisacodyl  10 mg Rectal Once  . heparin  5,000 Units Subcutaneous 3 times per day  . lubiprostone  24 mcg Oral BID WC  . senna-docusate  2  tablet Oral BID    Continuous Infusions: . 0.9 % NaCl with KCl 20 mEq / L 75 mL/hr at 11/14/13 1314    Past Medical History  Diagnosis Date  . Pneumothorax on left     Past Surgical History  Procedure Laterality Date  . Chest tube insertion    . Video assisted thoracoscopy  06/05/2012    Procedure: VIDEO ASSISTED THORACOSCOPY;  Surgeon: Kerin PernaPeter Van Trigt, MD;  [facility];  Service: Thoracic;  Laterality: Right;  . Pleuradesis  06/05/2012    Procedure: Cheron EveryPLEURADESIS;  Surgeon: Kerin PernaPeter Van Trigt, MD;  [facility];  Service: Thoracic;  Laterality: Right;  Mechanical Pleuradesis   . Resection of apical bleb  06/05/2012    Procedure: RESECTION OF APICAL BLEB;  Surgeon: Kerin PernaPeter Van Trigt, MD;  [facility];  Service: Thoracic;  Laterality: Right;    Akiya Morr A. Mayford KnifeWilliams, RD, LDN Pager: [phone]

## 2013-11-15 NOTE — Progress Notes (Signed)
UR Completed.  Jenalyn Girdner Jane Zakiyyah [place] [phone] 11/15/2013  

## 2013-11-15 NOTE — Care Management Note (Signed)
    Page 1 of 1   11/15/2013     11:00:41 AM CARE MANAGEMENT NOTE 11/15/2013  Patient:  Travis AbrahamsRAKES,Travis E   Account Number:  [id number]  Date Initiated:  11/15/2013  Documentation initiated by:  Rosemary HolmsOBSON,Hieu Herms  Subjective/Objective Assessment:   Pt lives at home. Independent. Has not PCP.     Action/Plan:   Set up with PCP   Anticipated DC Date:  11/15/2013   Anticipated DC Plan:  HOME/SELF CARE      DC Planning Services  CM consult      Choice offered to / List presented to:             Status of service:  Completed, signed off Medicare Important Message given?   (If response is "NO", the following Medicare IM given date fields will be blank) Date Medicare IM given:   Date Additional Medicare IM given:    Discharge Disposition:  HOME/SELF CARE  Per UR Regulation:    If discussed at Long Length of Stay Meetings, dates discussed:    Comments:  11/15/13 Rosemary HolmsAmy Romar Woodrick RN BSN CM Pt set up with the [facility] on 11/19/13 at 10:30

## 2013-11-15 NOTE — Progress Notes (Signed)
According to night nurse pt had several bowel movements yesterday.  Pt resting at this time and unable to confirm this.

## 2014-05-17 IMAGING — CR DG CHEST 1V PORT
1 series · 1 of 1 positions shown · non-contrast
Comparison: 06/05/2012

CLINICAL DATA: Status post thoracic surgery.

PORTABLE CHEST - 1 VIEW

[AP]
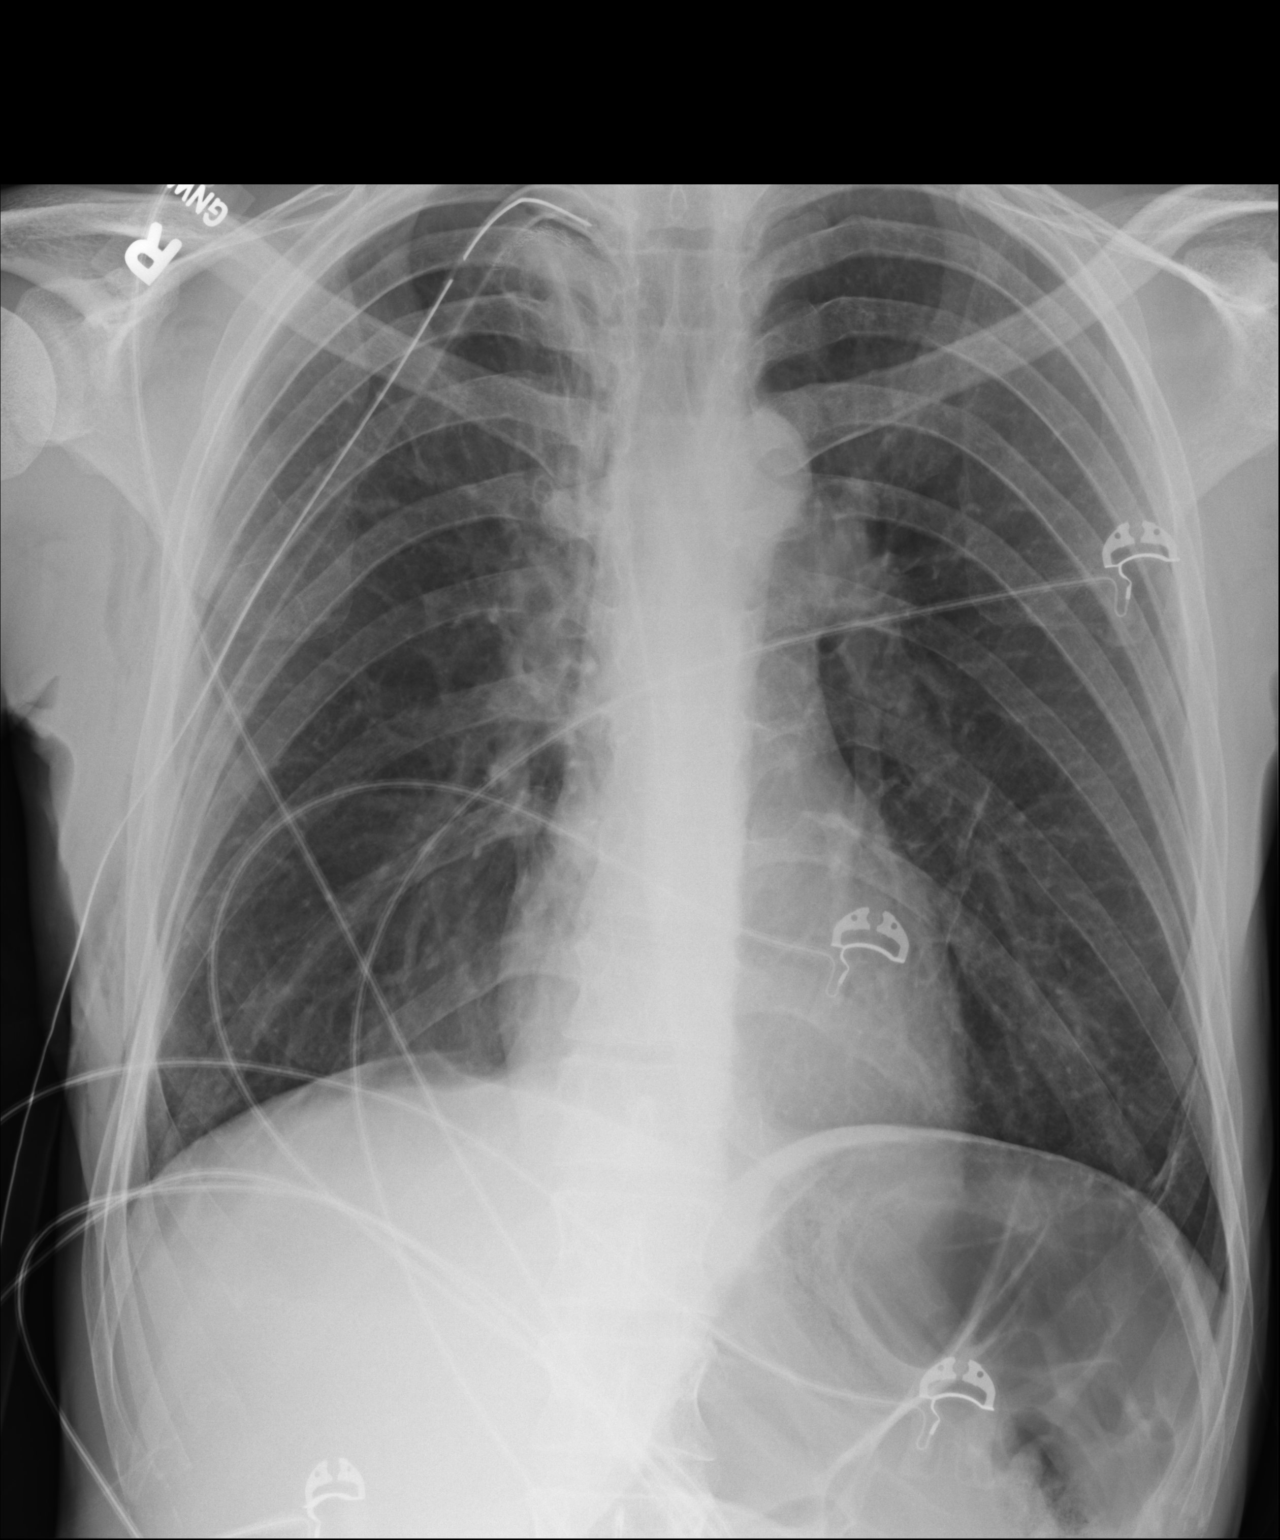

[1 of 1 positions shown; findings below may reference images not displayed]

FINDINGS: Tiny right apical pneumothorax appears slightly smaller
than on the previous day's study.  There is a pulmonary anastomose
the staple line at the right apex adjacent to the tip of the right-
sided chest tube.

The lungs are essentially clear.  No pleural effusion or left
pneumothorax.
IMPRESSION: Slight decrease in the size of a tiny right apical pneumothorax.
No other change.
